# Patient Record
Sex: Female | Born: 1955 | Race: Black or African American | Hispanic: No | Marital: Married | Smoking: Never smoker
Health system: Southern US, Community
[De-identification: ages and names within clinical notes are randomized; demographics above are authoritative.]

## PROBLEM LIST (undated history)

## (undated) DIAGNOSIS — M1990 Unspecified osteoarthritis, unspecified site: Secondary | ICD-10-CM

## (undated) DIAGNOSIS — D841 Defects in the complement system: Secondary | ICD-10-CM

## (undated) DIAGNOSIS — A64 Unspecified sexually transmitted disease: Secondary | ICD-10-CM

## (undated) DIAGNOSIS — T783XXA Angioneurotic edema, initial encounter: Secondary | ICD-10-CM

## (undated) DIAGNOSIS — I10 Essential (primary) hypertension: Secondary | ICD-10-CM

## (undated) HISTORY — DX: Angioneurotic edema, initial encounter: T78.3XXA

## (undated) HISTORY — DX: Unspecified osteoarthritis, unspecified site: M19.90

## (undated) HISTORY — DX: Unspecified sexually transmitted disease: A64

---

## 1987-02-23 DIAGNOSIS — A64 Unspecified sexually transmitted disease: Secondary | ICD-10-CM

## 1987-02-23 HISTORY — DX: Unspecified sexually transmitted disease: A64

## 1998-05-15 ENCOUNTER — Ambulatory Visit (HOSPITAL_COMMUNITY): Admission: RE | Admit: 1998-05-15 | Discharge: 1998-05-15 | Payer: Self-pay

## 1998-05-30 ENCOUNTER — Other Ambulatory Visit: Admission: RE | Admit: 1998-05-30 | Discharge: 1998-05-30 | Payer: Self-pay | Admitting: Obstetrics and Gynecology

## 1998-08-23 HISTORY — PX: TUBAL LIGATION: SHX77

## 1998-09-01 ENCOUNTER — Encounter: Payer: Self-pay | Admitting: Obstetrics and Gynecology

## 1998-09-02 ENCOUNTER — Ambulatory Visit (HOSPITAL_COMMUNITY): Admission: RE | Admit: 1998-09-02 | Discharge: 1998-09-02 | Payer: Self-pay | Admitting: Obstetrics and Gynecology

## 2002-01-01 ENCOUNTER — Other Ambulatory Visit: Admission: RE | Admit: 2002-01-01 | Discharge: 2002-01-01 | Payer: Self-pay | Admitting: Obstetrics and Gynecology

## 2003-01-28 ENCOUNTER — Other Ambulatory Visit: Admission: RE | Admit: 2003-01-28 | Discharge: 2003-01-28 | Payer: Self-pay | Admitting: Obstetrics and Gynecology

## 2004-02-14 ENCOUNTER — Other Ambulatory Visit: Admission: RE | Admit: 2004-02-14 | Discharge: 2004-02-14 | Payer: Self-pay | Admitting: Obstetrics and Gynecology

## 2005-05-06 ENCOUNTER — Other Ambulatory Visit: Admission: RE | Admit: 2005-05-06 | Discharge: 2005-05-06 | Payer: Self-pay | Admitting: Obstetrics & Gynecology

## 2006-05-12 ENCOUNTER — Other Ambulatory Visit: Admission: RE | Admit: 2006-05-12 | Discharge: 2006-05-12 | Payer: Self-pay | Admitting: *Deleted

## 2007-07-06 ENCOUNTER — Other Ambulatory Visit: Admission: RE | Admit: 2007-07-06 | Discharge: 2007-07-06 | Payer: Self-pay | Admitting: Obstetrics and Gynecology

## 2012-02-24 ENCOUNTER — Other Ambulatory Visit: Payer: Self-pay | Admitting: Family Medicine

## 2012-02-24 ENCOUNTER — Ambulatory Visit
Admission: RE | Admit: 2012-02-24 | Discharge: 2012-02-24 | Disposition: A | Discharge status: Home / Self Care | Payer: Managed Care, Other (non HMO) | Source: Ambulatory Visit | Attending: Family Medicine | Admitting: Family Medicine

## 2012-02-24 DIAGNOSIS — M25519 Pain in unspecified shoulder: Secondary | ICD-10-CM

## 2012-07-04 ENCOUNTER — Other Ambulatory Visit: Payer: Self-pay | Admitting: Orthopedic Surgery

## 2012-07-10 ENCOUNTER — Encounter (HOSPITAL_BASED_OUTPATIENT_CLINIC_OR_DEPARTMENT_OTHER): Payer: Self-pay | Admitting: *Deleted

## 2012-07-14 NOTE — H&P (Signed)
Sheri Shelton is an 57 y.o. female.   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 Xrays reveal significant degenerative arthritis of right ring finger distal interphalangeal joint with likely loose body present. 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  Two-month history of enlarging mucoid cyst with significant degenerative arthritis of right ring finger distal interphalangeal joint.  Past Medical History  Diagnosis Date  . Hypertension     No past surgical history on file.  No family history on file. Social History:  has no tobacco, alcohol, and drug history on file.  Allergies:  Allergies  Allergen Reactions  . Soy Allergy Nausea And Vomiting    SOY MILK ONLY    No prescriptions prior to admission    No results found for this or any previous visit (from the past 48 hour(s)). No results found.  Review of Systems  Constitutional: Negative.   HENT: Negative.   Eyes: Negative.   Respiratory: Negative.   Cardiovascular: Negative.   Gastrointestinal: Negative.   Genitourinary: Negative.   Musculoskeletal: Negative.   Skin: Negative.   Neurological: Negative.   Endo/Heme/Allergies: Negative.   Psychiatric/Behavioral: Negative.     Height 5' 5.25" (1.657 m), weight 69.854 kg (154 lb). Physical Exam  Constitutional: She is oriented to person, place, and time. She appears well-nourished.  HENT:  Head: Normocephalic and atraumatic.  Eyes: Conjunctivae and EOM are normal. Pupils are equal, round, and reactive to light.  Neck: Normal range of motion. Neck supple.  Cardiovascular: Normal rate, regular rhythm, normal heart sounds and intact distal pulses.   Respiratory: Effort normal and breath sounds normal.  GI: Soft. Bowel sounds are normal.  Musculoskeletal: Normal range of motion.  Significant degenerative arthritis right ring finger distal interphalangeal joint. X-rays reveal marked joint space narrowing. Possible loose body.   Neurological: She is alert and oriented to person, place, and time. She has normal reflexes.  Psychiatric: She has a normal mood and affect. Her behavior is normal. Judgment and thought content normal.     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 I and distal interphalangeal joint debridement with loose body removal irrigation synovectomy and excision of mucoid cyst.  The surgery aftercare potential risk and benefits were explained in detail. Questions were invited and answered.  Dontreal Miera JR,Carmon Brigandi V 07/14/2012, 2:45 PM

## 2012-07-18 ENCOUNTER — Ambulatory Visit (HOSPITAL_BASED_OUTPATIENT_CLINIC_OR_DEPARTMENT_OTHER)
Admission: RE | Admit: 2012-07-18 | Discharge: 2012-07-18 | Disposition: A | Discharge status: Home / Self Care | Payer: Managed Care, Other (non HMO) | Source: Ambulatory Visit | Attending: Orthopedic Surgery | Admitting: Orthopedic Surgery

## 2012-07-18 ENCOUNTER — Encounter (HOSPITAL_BASED_OUTPATIENT_CLINIC_OR_DEPARTMENT_OTHER)
Admission: RE | Disposition: A | Discharge status: Home / Self Care | Payer: Self-pay | Source: Ambulatory Visit | Attending: Orthopedic Surgery

## 2012-07-18 ENCOUNTER — Encounter (HOSPITAL_BASED_OUTPATIENT_CLINIC_OR_DEPARTMENT_OTHER): Payer: Self-pay | Admitting: Orthopedic Surgery

## 2012-07-18 DIAGNOSIS — M19049 Primary osteoarthritis, unspecified hand: Secondary | ICD-10-CM | POA: Insufficient documentation

## 2012-07-18 DIAGNOSIS — D2110 Benign neoplasm of connective and other soft tissue of unspecified upper limb, including shoulder: Secondary | ICD-10-CM | POA: Insufficient documentation

## 2012-07-18 DIAGNOSIS — M659 Unspecified synovitis and tenosynovitis, unspecified site: Secondary | ICD-10-CM | POA: Insufficient documentation

## 2012-07-18 DIAGNOSIS — M898X9 Other specified disorders of bone, unspecified site: Secondary | ICD-10-CM | POA: Insufficient documentation

## 2012-07-18 HISTORY — DX: Essential (primary) hypertension: I10

## 2012-07-18 HISTORY — PX: MASS EXCISION: SHX2000

## 2012-07-18 SURGERY — MINOR EXCISION OF MASS
Anesthesia: LOCAL | Site: Hand | Laterality: Right | Wound class: Clean

## 2012-07-18 MED ORDER — OXYCODONE-ACETAMINOPHEN 5-325 MG PO TABS: ORAL_TABLET | ORAL | Status: DC

## 2012-07-18 MED ORDER — DOXYCYCLINE HYCLATE 50 MG PO CAPS: 50.0000 mg | ORAL_CAPSULE | Freq: Two times a day (BID) | ORAL | Status: DC

## 2012-07-18 MED ORDER — LIDOCAINE HCL 2 % IJ SOLN
INTRAMUSCULAR | Status: DC | PRN
  Administered 2012-07-18: 4 mL

## 2012-07-18 MED ORDER — CHLORHEXIDINE GLUCONATE 4 % EX LIQD: 60.0000 mL | Freq: Once | CUTANEOUS | Status: DC

## 2012-07-18 SURGICAL SUPPLY — 41 items
BANDAGE ADHESIVE 1X3 (GAUZE/BANDAGES/DRESSINGS) IMPLANT
BLADE SURG 15 STRL LF DISP TIS (BLADE) ×1 IMPLANT
BLADE SURG 15 STRL SS (BLADE) ×1
BNDG COHESIVE 1X5 TAN STRL LF (GAUZE/BANDAGES/DRESSINGS) ×2 IMPLANT
BNDG ELASTIC 2 VLCR STRL LF (GAUZE/BANDAGES/DRESSINGS) IMPLANT
BNDG ESMARK 4X9 LF (GAUZE/BANDAGES/DRESSINGS) IMPLANT
BRUSH SCRUB EZ PLAIN DRY (MISCELLANEOUS) ×2 IMPLANT
CLOTH BEACON ORANGE TIMEOUT ST (SAFETY) ×2 IMPLANT
CORDS BIPOLAR (ELECTRODE) IMPLANT
COVER MAYO STAND STRL (DRAPES) ×2 IMPLANT
CUFF TOURNIQUET SINGLE 18IN (TOURNIQUET CUFF) IMPLANT
DECANTER SPIKE VIAL GLASS SM (MISCELLANEOUS) IMPLANT
DRAIN PENROSE 1/2X12 LTX STRL (WOUND CARE) ×2 IMPLANT
DRAPE SURG 17X23 STRL (DRAPES) ×2 IMPLANT
GAUZE SPONGE 4X4 12PLY STRL LF (GAUZE/BANDAGES/DRESSINGS) ×4 IMPLANT
GAUZE XEROFORM 1X8 LF (GAUZE/BANDAGES/DRESSINGS) ×2 IMPLANT
GLOVE BIO SURGEON STRL SZ 6 (GLOVE) ×2 IMPLANT
GLOVE BIOGEL M STRL SZ7.5 (GLOVE) IMPLANT
GLOVE ECLIPSE 6.5 STRL STRAW (GLOVE) ×2 IMPLANT
GLOVE EXAM NITRILE EXT CUFF MD (GLOVE) ×2 IMPLANT
GLOVE INDICATOR 6.5 STRL GRN (GLOVE) ×2 IMPLANT
GLOVE INDICATOR 7.0 STRL GRN (GLOVE) ×2 IMPLANT
GLOVE ORTHO TXT STRL SZ7.5 (GLOVE) ×2 IMPLANT
GOWN BRE IMP PREV XXLGXLNG (GOWN DISPOSABLE) IMPLANT
GOWN PREVENTION PLUS XLARGE (GOWN DISPOSABLE) IMPLANT
NEEDLE 27GAX1X1/2 (NEEDLE) ×2 IMPLANT
NEEDLE BLUNT 17GA (NEEDLE) ×2 IMPLANT
PACK BASIN DAY SURGERY FS (CUSTOM PROCEDURE TRAY) ×2 IMPLANT
PADDING CAST ABS 4INX4YD NS (CAST SUPPLIES)
PADDING CAST ABS COTTON 4X4 ST (CAST SUPPLIES) IMPLANT
SPONGE GAUZE 4X4 12PLY (GAUZE/BANDAGES/DRESSINGS) IMPLANT
STOCKINETTE 4X48 STRL (DRAPES) ×2 IMPLANT
STRIP CLOSURE SKIN 1/2X4 (GAUZE/BANDAGES/DRESSINGS) IMPLANT
SUT ETHILON 5 0 P 3 18 (SUTURE)
SUT NYLON ETHILON 5-0 P-3 1X18 (SUTURE) IMPLANT
SUT PROLENE 4 0 P 3 18 (SUTURE) ×2 IMPLANT
SYR 3ML 23GX1 SAFETY (SYRINGE) IMPLANT
SYR CONTROL 10ML LL (SYRINGE) ×4 IMPLANT
TOWEL OR 17X24 6PK STRL BLUE (TOWEL DISPOSABLE) ×4 IMPLANT
TRAY DSU PREP LF (CUSTOM PROCEDURE TRAY) ×2 IMPLANT
UNDERPAD 30X30 INCONTINENT (UNDERPADS AND DIAPERS) ×2 IMPLANT

## 2012-07-18 NOTE — Brief Op Note (Signed)
07/18/2012  12:28 PM  PATIENT:  Sheri Shelton  57 y.o. female  PRE-OPERATIVE DIAGNOSIS:  CYSTIC MASS RIGHT RING FINGER DORSUM DIP JOINT  POST-OPERATIVE DIAGNOSIS:  CYSTIC MASS RIGHT RING FINGER DORSUM DIP JOINT  PROCEDURE:  Procedure(s): MINOR EXCISION MASS DORSUM WITH DIP JOINT ARTHROTOMY RIGHT RING FINGER (Right)  SURGEON:  Surgeon(s) and Role:    * Wyn Forster., MD - Primary  PHYSICIAN ASSISTANT:   ASSISTANTS: Surgical technician   ANESTHESIA:   local  EBL:     BLOOD ADMINISTERED:none  DRAINS: none   LOCAL MEDICATIONS USED:  XYLOCAINE   SPECIMEN:  No Specimen  DISPOSITION OF SPECIMEN:  N/A  COUNTS:  YES  TOURNIQUET:   Total Tourniquet Time Documented: area (laterality) - 14 minutes Total: area (laterality) - 14 minutes   DICTATION: .Other Dictation: Dictation Number [id number]  PLAN OF CARE: Discharge to home after PACU  PATIENT DISPOSITION:  PACU - hemodynamically stable.   Delay start of Pharmacological VTE agent (>24hrs) due to surgical blood loss or risk of bleeding: not applicable

## 2012-07-18 NOTE — Interval H&P Note (Signed)
History and Physical Interval Note:  07/18/2012 11:32 AM  Sheri Shelton  has presented today for surgery, with the diagnosis of CYSTIC MASS RIGHT RING FINGER DORSUM DIP JOINT  The various methods of treatment have been discussed with the patient and family. After consideration of risks, benefits and other options for treatment, the patient has consented to  Procedure(s): MINOR EXCISION MASS DORSUM WITH DIP JOINT ARTHROTOMY RIGHT RING FINGER (Right) as a surgical intervention .  The patient's history has been reviewed, patient examined, no change in status, stable for surgery.  I have reviewed the patient's chart and labs.  Questions were answered to the patient's satisfaction.     Amilio Zehnder JR,Amarea Macdowell V

## 2012-07-18 NOTE — Op Note (Signed)
[id number] 

## 2012-07-19 NOTE — Op Note (Signed)
[name], [name] NO.:  [id number]  MEDICAL RECORD NO.:  [id number]  LOCATION:                                 FACILITY:  PHYSICIAN:  Katy Fitch. Devory Mckinzie, M.D. DATE OF BIRTH:  01/14/1956  DATE OF PROCEDURE:  07/18/2012 DATE OF DISCHARGE:                              OPERATIVE REPORT   PREOPERATIVE DIAGNOSIS:  Large mucoid cyst dorsal ulnar aspect of right ring finger distal interphalangeal joint with plain x-ray evidence of advanced osteoarthritis and no radio-opaque osteochondral loose bodies.  POSTOPERATIVE DIAGNOSIS:  Large mucoid cyst dorsal ulnar aspect of right ring finger distal interphalangeal joint with plain x-ray evidence of advanced osteoarthritis and no radio-opaque osteochondral loose bodies with identification of marked synovitis and multiple chondral loose bodies within distal interphalangeal joint.  OPERATION: 1. Resection of mucoid cyst, right ring finger. 2. DIP joint arthrotomy with synovectomy and removal of loose bodies,     right ring finger.  OPERATING SURGEON:  Katy Fitch. Altair Appenzeller, M.D.  ASSISTANT:  Surgical technician.  ANESTHESIA:  2% lidocaine metacarpal head level block of right ring finger.  This was performed as a minor operating room procedure.  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  She has had a history of swelling at her right ring finger DIP joint and then developed an 8 mm diameter mucoid cyst.  This was thinning and preparing to rupture.  She was referred by her physician for evaluation and management of this predicament.  We advised her that it was driven by underlying osteoarthritis.  She understands we cannot change the natural history of osteoarthritis as it is primarily a genetic and/or posttraumatic predicament.  We can however modify the natural history of the mucoid cyst by DIP joint debridement, removal of loose bodies, and synovectomy.  __________  100% successful debridement of DIP joint typically leads to correction of mucoid cyst above 90%.  After these issues were discussed in detail in the office, Sheri Shelton decided to proceed with surgery at this time.  Preoperatively, she was reminded of the risks and benefits of surgery. Her finger was marked with a proper surgical identification site protocol in the holding area.  PROCEDURE:  Sheri Shelton was escorted to room 6 of the [facility] and placed in a supine position upon the operating table.  After anesthesia, informed consent, and alcohol Betadine prep, her right ring finger was anesthetized with 3 mL of 2% lidocaine at metacarpal head level to obtain a digital block.  After 10 minutes, excellent anesthesia was achieved.  The right hand and arm were then prepped with Betadine soap and solution, sterilely draped.  The finger was exsanguinated by direct compression and a padded Penrose 0.5 inch drain was placed over the proximal phalangeal segment as a digital tourniquet.  After confirming adequate anesthesia, a routine surgical time-out was accomplished followed by a curvilinear incision incorporating the cyst in the distal portion of the incision.  Subcutaneous tissues were carefully divided taking care to identify the cyst membrane.  The cyst membrane was excised with a micro rongeur followed by an arthrotomy between the terminal extensor tendon slip and  the ulnar collateral ligament.  The capsule was excised and abundant synovitis identified.  Micro rongeur and house curette were used to remove the synovitis followed by removal of marginal osteophytes with a micro curette and rongeur.  The DIP joint was then thoroughly irrigated with a 19-gauge blunt dental needle and sterile saline using a syringe for pressure. Once again the subcutaneous region was debrided thoroughly to be sure all membrane and the neck of the cyst was excised followed by repair of the  skin with intradermal 4-0 Prolene.  Xeroflo dressing was applied followed by release of the tourniquet. Direct pressure was held for 5 minutes by a sterile gauze dressing with Coban.  Sheri Shelton is advised to elevate her hand for several days.  She will work on range of motion exercises.  She may return to work tomorrow.  We will see her back for followup in the office in 1 week for dressing removal and suture removal.  She has been advised not to change her dressing at home.  It is quite easy for individuals to get the dressing too tight on a finger.  If she has difficulties with the dressing, she will call our office and we will change it for her on an as-needed basis.     Katy Fitch Sway Guttierrez, M.D.     RVS/MEDQ  D:  07/18/2012  T:  07/19/2012  Job:  [id number]

## 2012-12-26 ENCOUNTER — Encounter: Payer: Self-pay | Admitting: Gynecology

## 2012-12-26 DIAGNOSIS — I10 Essential (primary) hypertension: Secondary | ICD-10-CM | POA: Insufficient documentation

## 2012-12-27 ENCOUNTER — Ambulatory Visit (INDEPENDENT_AMBULATORY_CARE_PROVIDER_SITE_OTHER): Payer: Managed Care, Other (non HMO) | Admitting: Gynecology

## 2012-12-27 ENCOUNTER — Encounter: Payer: Self-pay | Admitting: Gynecology

## 2012-12-27 VITALS — BP 124/68 | HR 66 | Resp 18 | Ht 65.0 in | Wt 157.0 lb

## 2012-12-27 DIAGNOSIS — Z0000 Encounter for general adult medical examination without abnormal findings: Secondary | ICD-10-CM

## 2012-12-27 DIAGNOSIS — Z01419 Encounter for gynecological examination (general) (routine) without abnormal findings: Secondary | ICD-10-CM

## 2012-12-27 LAB — POCT URINALYSIS DIPSTICK

## 2012-12-27 NOTE — Patient Instructions (Signed)

## 2012-12-27 NOTE — Progress Notes (Signed)
57 y.o. Married [place] American female   [phone] here for annual exam. Pt reports menses are not regular.  She does report hot flashes, does have night sweats for 43m, does not have vaginal dryness.  She is not using lubricants.  She does not report post-menopasual bleeding.    Patient's last menstrual period was 10/12/2011.          Sexually active: yes  The current method of family planning is post menopausal status.    Exercising: yes  treadmill, weights 3x/wk Last pap: 11/18/11 NEG HR HPV Abnormal PAP: no Mammogram: 10/18/12 Bi-Rads 1 BSE: yes Colonoscopy: May or June 2014 DEXA: 2007 Alcohol: 3 glasses of wine/wk Tobacco: no   Hgb: PCP ; Urine: Negative   Health Maintenance  Topic Date Due  . Pap Smear  03/24/1974  . Tetanus/tdap  03/25/1975  . Colonoscopy  03/24/2006  . Influenza Vaccine  09/22/2012  . Mammogram  10/19/2014    Family History  Problem Relation Age of Onset  . Heart disease Father   . Diabetes Maternal Grandmother   . Breast cancer Maternal Grandmother     Patient Active Problem List   Diagnosis Date Noted  . Hypertension     Past Medical History  Diagnosis Date  . Hypertension   . STD (sexually transmitted disease) 1989    History of GC/CHL    Past Surgical History  Procedure Laterality Date  . Mass excision Right 07/18/2012    Procedure: MINOR EXCISION MASS DORSUM WITH DIP JOINT ARTHROTOMY RIGHT RING FINGER;  Surgeon: Wyn Forster., MD;  Location: [facility];  Service: Orthopedics;  Laterality: Right;  . Tubal ligation  08/1998    Allergies: Soy allergy  Current Outpatient Prescriptions  Medication Sig Dispense Refill  . cholecalciferol (VITAMIN D) 1000 UNITS tablet Take 1,000 Units by mouth daily.      . Multiple Vitamin (MULTIVITAMIN) tablet Take 1 tablet by mouth daily.      . NORVASC 5 MG tablet daily.      Marland Kitchen telmisartan-hydrochlorothiazide (MICARDIS HCT) 80-12.5 MG per tablet Take 1 tablet by mouth daily.        No current facility-administered medications for this visit.    ROS: Pertinent items are noted in HPI.  Exam:    BP 124/68  Pulse 66  Resp 18  LMP 10/12/2011 Weight change: @WEIGHTCHANGE @ Last 3 height recordings:  Ht Readings from Last 3 Encounters:  07/10/12 5' 5.25" (1.657 m)  07/10/12 5' 5.25" (1.657 m)   General appearance: alert, cooperative and appears stated age Head: Normocephalic, without obvious abnormality, atraumatic Neck: no adenopathy, no carotid bruit, no JVD, supple, symmetrical, trachea midline and thyroid not enlarged, symmetric, no tenderness/mass/nodules Lungs: clear to auscultation bilaterally Breasts: normal appearance, no masses or tenderness Heart: regular rate and rhythm, S1, S2 normal, no murmur, click, rub or gallop Abdomen: soft, non-tender; bowel sounds normal; no masses,  no organomegaly Extremities: extremities normal, atraumatic, no cyanosis or edema Skin: Skin color, texture, turgor normal. No rashes or lesions Lymph nodes: Cervical, supraclavicular, and axillary nodes normal. no inguinal nodes palpated Neurologic: Grossly normal   Pelvic: External genitalia:  no lesions              Urethra: normal appearing urethra with no masses, tenderness or lesions              Bartholins and Skenes: normal                 Vagina:  normal appearing vagina with normal color and discharge, no lesions              Cervix: normal appearance              Pap taken: no        Bimanual Exam:  Uterus:  uterus is normal size, shape, consistency and nontender                                      Adnexa:    normal adnexa in size, nontender and no masses                                      Rectovaginal: Confirms                                      Anus:  normal sphincter tone, no lesions  A: well woman Contraceptive management     P: mammogram pap smear not done counseled on breast self exam, mammography screening, adequate intake of calcium and vitamin  D, diet and exercise return annually or prn Discussed PAP guideline changes, importance of weight bearing exercises, calcium, vit D and balanced diet.  An After Visit Summary was printed and given to the patient.

## 2012-12-28 ENCOUNTER — Other Ambulatory Visit: Payer: Self-pay

## 2013-01-17 ENCOUNTER — Emergency Department (HOSPITAL_BASED_OUTPATIENT_CLINIC_OR_DEPARTMENT_OTHER)
Admission: EM | Admit: 2013-01-17 | Discharge: 2013-01-18 | Disposition: A | Discharge status: Home / Self Care | Payer: Managed Care, Other (non HMO) | Attending: Emergency Medicine | Admitting: Emergency Medicine

## 2013-01-17 ENCOUNTER — Encounter (HOSPITAL_BASED_OUTPATIENT_CLINIC_OR_DEPARTMENT_OTHER): Payer: Self-pay | Admitting: Emergency Medicine

## 2013-01-17 DIAGNOSIS — T783XXA Angioneurotic edema, initial encounter: Secondary | ICD-10-CM | POA: Insufficient documentation

## 2013-01-17 DIAGNOSIS — T465X5A Adverse effect of other antihypertensive drugs, initial encounter: Secondary | ICD-10-CM | POA: Insufficient documentation

## 2013-01-17 DIAGNOSIS — I10 Essential (primary) hypertension: Secondary | ICD-10-CM | POA: Insufficient documentation

## 2013-01-17 DIAGNOSIS — Z8619 Personal history of other infectious and parasitic diseases: Secondary | ICD-10-CM | POA: Insufficient documentation

## 2013-01-17 DIAGNOSIS — Z79899 Other long term (current) drug therapy: Secondary | ICD-10-CM | POA: Insufficient documentation

## 2013-01-17 DIAGNOSIS — T464X5A Adverse effect of angiotensin-converting-enzyme inhibitors, initial encounter: Secondary | ICD-10-CM

## 2013-01-17 NOTE — ED Notes (Signed)
Pt started norvasc 3 months ago but was recently started on micardis along with norvasc

## 2013-01-18 MED ORDER — METHYLPREDNISOLONE SODIUM SUCC 125 MG IJ SOLR: 125.0000 mg | Freq: Once | INTRAMUSCULAR | Status: AC

## 2013-01-18 MED ORDER — FAMOTIDINE 20 MG PO TABS: 20.0000 mg | ORAL_TABLET | Freq: Two times a day (BID) | ORAL | Status: DC

## 2013-01-18 MED ORDER — METHYLPREDNISOLONE SODIUM SUCC 125 MG IJ SOLR
INTRAMUSCULAR | Status: AC
  Administered 2013-01-18: 125 mg via INTRAVENOUS
  Filled 2013-01-18: qty 2

## 2013-01-18 MED ORDER — DIPHENHYDRAMINE HCL 50 MG/ML IJ SOLN: 25.0000 mg | Freq: Once | INTRAMUSCULAR | Status: AC

## 2013-01-18 MED ORDER — PREDNISONE 20 MG PO TABS: ORAL_TABLET | ORAL | Status: DC

## 2013-01-18 MED ORDER — DIPHENHYDRAMINE HCL 50 MG/ML IJ SOLN
INTRAMUSCULAR | Status: AC
  Administered 2013-01-18: 25 mg via INTRAVENOUS
  Filled 2013-01-18: qty 1

## 2013-01-18 MED ORDER — FAMOTIDINE IN NACL 20-0.9 MG/50ML-% IV SOLN
20.0000 mg | Freq: Once | INTRAVENOUS | Status: AC
  Administered 2013-01-18: 20 mg via INTRAVENOUS

## 2013-01-18 MED ORDER — FAMOTIDINE IN NACL 20-0.9 MG/50ML-% IV SOLN
INTRAVENOUS | Status: AC
  Filled 2013-01-18: qty 50

## 2013-01-18 NOTE — ED Provider Notes (Signed)
CSN: [id number]     Arrival date & time 01/17/13  2350 History   First MD Initiated Contact with Patient 01/17/13 2356     Chief Complaint  Patient presents with  . Allergic Reaction   (Consider location/radiation/quality/duration/timing/severity/associated sxs/prior Treatment) Patient is a 57 y.o. female presenting with allergic reaction. The history is provided by the patient.  Allergic Reaction Presenting symptoms: swelling   Presenting symptoms: no wheezing   Presenting symptoms comment:  Tongue Swelling:    Location: tongue.   Onset quality:  Gradual   Timing:  Constant   Progression:  Unchanged   Chronicity:  Recurrent (4th time in four months) Severity:  Moderate Context: medications   Context: no animal exposure   Relieved by:  Nothing Worsened by:  Nothing tried Ineffective treatments:  None tried   Past Medical History  Diagnosis Date  . Hypertension   . STD (sexually transmitted disease) 1989    History of GC/CHL   Past Surgical History  Procedure Laterality Date  . Mass excision Right 07/18/2012    Procedure: MINOR EXCISION MASS DORSUM WITH DIP JOINT ARTHROTOMY RIGHT RING FINGER;  Surgeon: Wyn Forster., MD;  Location: [facility];  Service: Orthopedics;  Laterality: Right;  . Tubal ligation  08/1998   Family History  Problem Relation Age of Onset  . Heart disease Father   . Diabetes Maternal Grandmother   . Breast cancer Maternal Grandmother    History  Substance Use Topics  . Smoking status: Never Smoker   . Smokeless tobacco: Not on file  . Alcohol Use: 1.8 oz/week    3 Glasses of wine per week   OB History   Grav Para Term Preterm Abortions TAB SAB Ect Mult Living   4 2 2  2     2      Review of Systems  Respiratory: Negative for shortness of breath and wheezing.   All other systems reviewed and are negative.    Allergies  Soy allergy  Home Medications   Current Outpatient Rx  Name  Route  Sig  Dispense  Refill  .  diphenhydrAMINE (BENADRYL) 25 MG tablet   Oral   Take 50 mg by mouth every 6 (six) hours as needed.         Marland Kitchen ibuprofen (ADVIL,MOTRIN) 200 MG tablet   Oral   Take 200 mg by mouth every 6 (six) hours as needed.         . NORVASC 5 MG tablet      daily.         Marland Kitchen telmisartan-hydrochlorothiazide (MICARDIS HCT) 80-12.5 MG per tablet   Oral   Take 1 tablet by mouth daily.         . cholecalciferol (VITAMIN D) 1000 UNITS tablet   Oral   Take 1,000 Units by mouth daily.         . Multiple Vitamin (MULTIVITAMIN) tablet   Oral   Take 1 tablet by mouth daily.          BP 107/83  Pulse 74  Temp(Src) 98.3 F (36.8 C) (Oral)  Resp 18  Ht 5\' 5"  (1.651 m)  Wt 157 lb (71.215 kg)  BMI 26.13 kg/m2  SpO2 99%  LMP 10/12/2011 Physical Exam  Constitutional: She is oriented to person, place, and time. She appears well-developed and well-nourished. No distress.  HENT:  Head: Normocephalic and atraumatic.  Mouth/Throat: Oropharynx is clear and moist.  Swelling of the tongue, mallempati class 3  Eyes: Conjunctivae are normal. Pupils are equal, round, and reactive to light.  Neck: Normal range of motion. Neck supple.  Cardiovascular: Normal rate, regular rhythm and intact distal pulses.   Pulmonary/Chest: Effort normal and breath sounds normal. No stridor. She has no wheezes. She has no rales.  Abdominal: Soft. Bowel sounds are normal. There is no tenderness. There is no rebound and no guarding.  Musculoskeletal: Normal range of motion.  Neurological: She is alert and oriented to person, place, and time.  Skin: Skin is warm and dry. No rash noted.  Psychiatric: She has a normal mood and affect.    ED Course  Procedures (including critical care time) Labs Review Labs Reviewed - No data to display Imaging Review No results found.  EKG Interpretation   None       MDM  No diagnosis found. Given steroids and pepcid and benadryl with significant improvement.  Mallempati  class one,  Feels back to normal.  Stop micardis immediately and list as allergy.  Will prescribe pepcid and steroids continue benadryl every six gours for 2 days, call your doctor to say you have stopped micardis.  Call 911 immediately for worsening swelling of the lips or tongue difficulty swallowing or breathing or any concerns.  Patient verbalizes understanding and agrees to follow up    Osmin Welz K Karri Kallenbach-Rasch, MD 01/18/13 0117

## 2013-01-18 NOTE — ED Notes (Signed)
Pt called husband to come pick her up 

## 2013-12-24 ENCOUNTER — Encounter (HOSPITAL_BASED_OUTPATIENT_CLINIC_OR_DEPARTMENT_OTHER): Payer: Self-pay | Admitting: Emergency Medicine

## 2014-04-24 IMAGING — CR DG SHOULDER 2+V*L*
3 series · 3 of 3 positions shown · non-contrast
Comparison: None

CLINICAL DATA: 56-year-old female with left shoulder pain.

LEFT SHOULDER - 2+ VIEW

[view not recorded (1 of 3)]
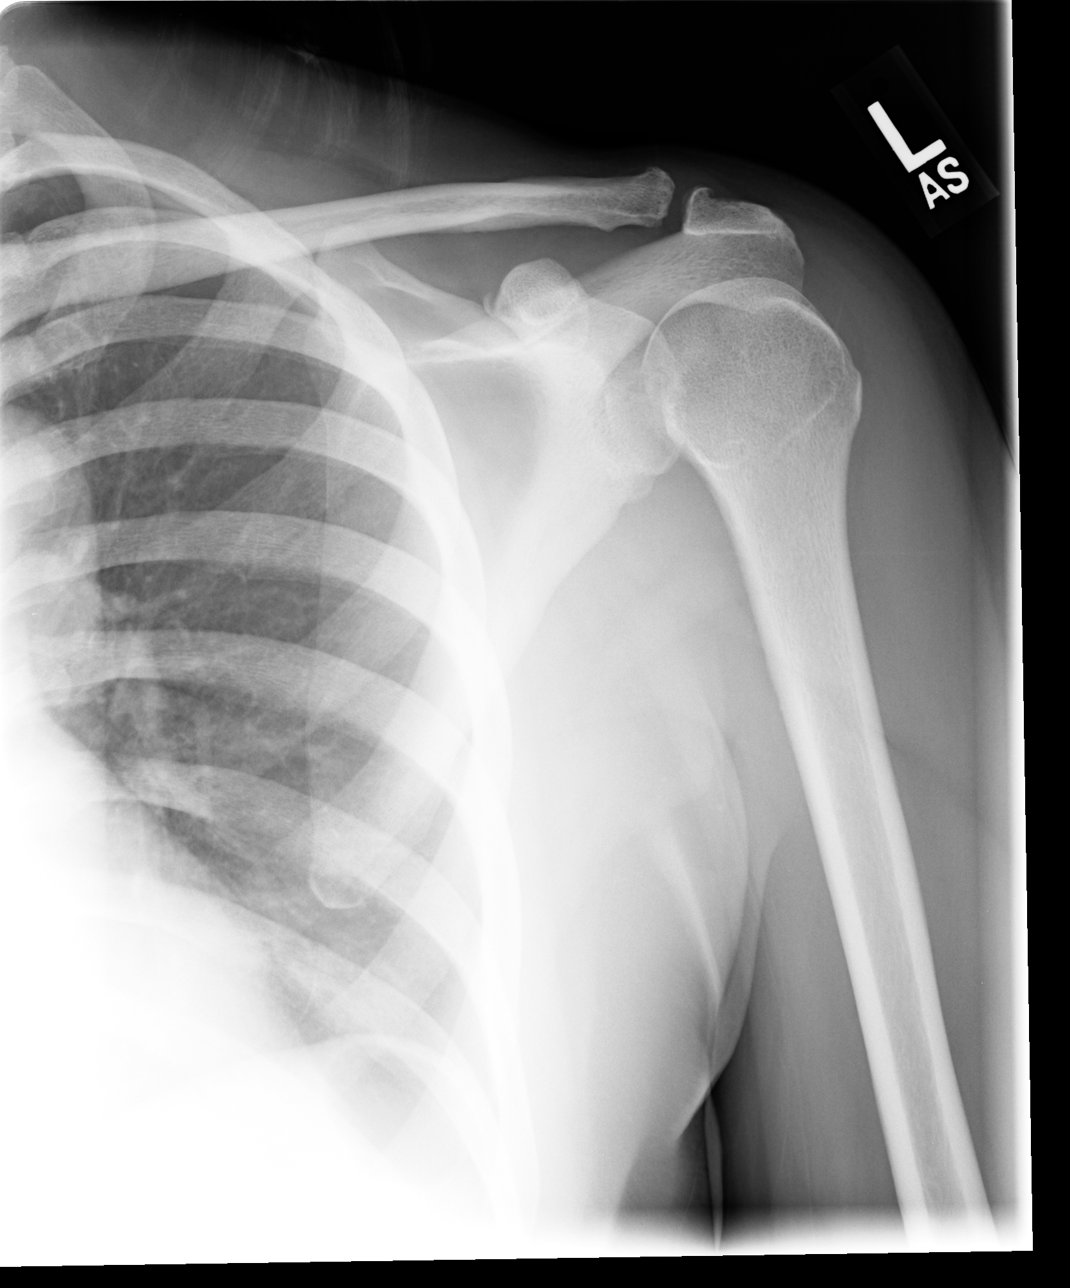

[view not recorded (2 of 3)]
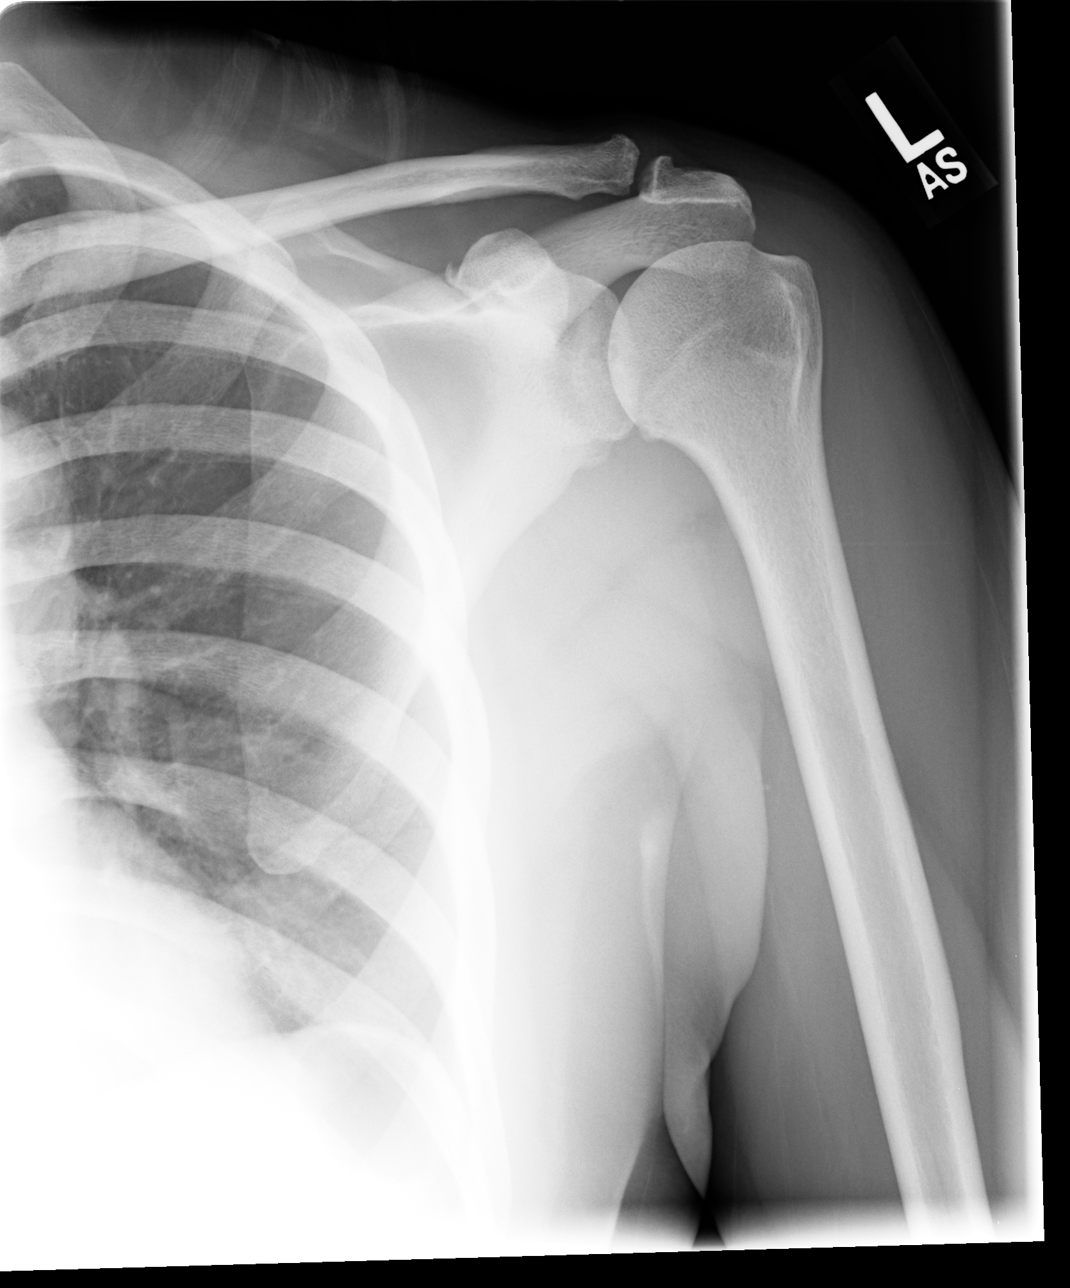

[view not recorded (3 of 3)]
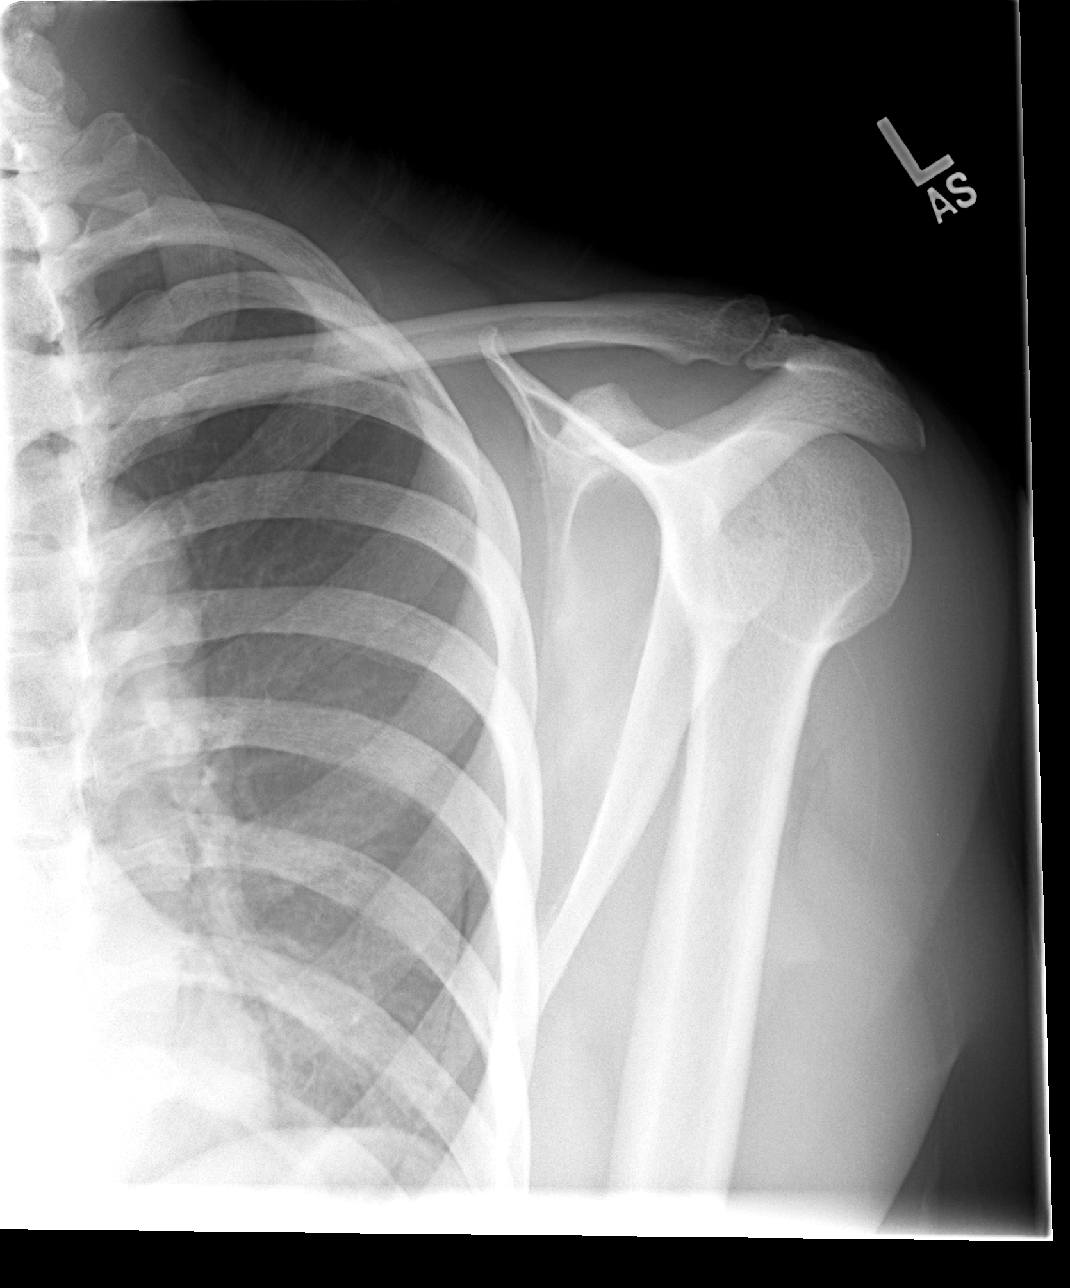

[3 of 3 positions shown; findings below may reference images not displayed]

FINDINGS: There is no evidence of acute bony abnormality.
There is no evidence of acute fracture, subluxation, or
dislocation.
No focal bony lesions are identified.
The visualized left hemithorax is unremarkable.
Minimal degenerative changes at the glenohumeral and AC joints
noted.
IMPRESSION: Minimal degenerative changes of the glenohumeral and AC joints.

No other significant abnormalities identified.

## 2014-05-10 ENCOUNTER — Telehealth: Payer: Self-pay | Admitting: *Deleted

## 2014-05-10 ENCOUNTER — Ambulatory Visit (INDEPENDENT_AMBULATORY_CARE_PROVIDER_SITE_OTHER): Payer: BLUE CROSS/BLUE SHIELD | Admitting: Nurse Practitioner

## 2014-05-10 ENCOUNTER — Encounter: Payer: Self-pay | Admitting: Nurse Practitioner

## 2014-05-10 VITALS — BP 98/68 | HR 78 | Resp 14 | Ht 64.75 in | Wt 150.0 lb

## 2014-05-10 DIAGNOSIS — Z113 Encounter for screening for infections with a predominantly sexual mode of transmission: Secondary | ICD-10-CM

## 2014-05-10 DIAGNOSIS — R319 Hematuria, unspecified: Secondary | ICD-10-CM

## 2014-05-10 DIAGNOSIS — Z23 Encounter for immunization: Secondary | ICD-10-CM | POA: Diagnosis not present

## 2014-05-10 DIAGNOSIS — Z01419 Encounter for gynecological examination (general) (routine) without abnormal findings: Secondary | ICD-10-CM

## 2014-05-10 DIAGNOSIS — Z0000 Encounter for general adult medical examination without abnormal findings: Secondary | ICD-10-CM

## 2014-05-10 LAB — POCT URINALYSIS DIPSTICK
Leukocytes, UA: NEGATIVE
Urobilinogen, UA: NEGATIVE
pH, UA: 5

## 2014-05-10 LAB — HEMOGLOBIN, FINGERSTICK: HEMOGLOBIN, FINGERSTICK: 12.7 g/dL (ref 12.0–16.0)

## 2014-05-10 NOTE — Telephone Encounter (Signed)
LM to [place] - return call to [facility] re: patient bone density scan.

## 2014-05-10 NOTE — Progress Notes (Signed)
59 y.o. [id number] Married  African American Fe here for annual exam.  Some vaso symptoms.  Worse at night but so far still tolerable.  She is concerned about an eruption of sores that have occurred for years between the buttocks or on the vulva area.  She is thinking they may be HSV related and wants all STD's checked.  Husband has no symptoms.  Patient's last menstrual period was 09/23/2011.          Sexually active: Yes.    The current method of family planning is tubal ligation.    Exercising: Yes.    Light weights, walking 3-4 x/wk Smoker:  no  Health Maintenance: Pap:  11/18/11 NEG HR HPV negative  MMG:  10/23/13 Bi-Rads 1: Negative Colonoscopy:  07/29/2006 f/u in 2013 - states she had repeat last year -trying to get a copy BMD:  Year ago -  trying to get a copy TDaP:  10-11 years ago Labs: Hgb: 12.7 ; Urine: RBC's ++ (asymptomatic)   reports that she has never smoked. She does not have any smokeless tobacco history on file. She reports that she drinks about 1.8 oz of alcohol per week. She reports that she does not use illicit drugs.  Past Medical History  Diagnosis Date  . Hypertension   . STD (sexually transmitted disease) 1989    History of GC/CHL  . Angio-edema   . Arthritis     Shoulder, Neck    Past Surgical History  Procedure Laterality Date  . Mass excision Right 07/18/2012    Procedure: MINOR EXCISION MASS DORSUM WITH DIP JOINT ARTHROTOMY RIGHT RING FINGER;  Surgeon: Cammie Sickle., MD;  Location: [place];  Service: Orthopedics;  Laterality: Right;  . Tubal ligation  08/1998    Current Outpatient Prescriptions  Medication Sig Dispense Refill  . diphenhydrAMINE (BENADRYL) 25 MG tablet Take 50 mg by mouth every 6 (six) hours as needed.    . Multiple Vitamin (MULTIVITAMIN) tablet Take 1 tablet by mouth daily.    . ranitidine (ZANTAC) 150 MG capsule Take 150 mg by mouth 2 (two) times daily.   3  . cholecalciferol (VITAMIN D) 1000 UNITS tablet Take 1,000  Units by mouth daily.    . famotidine (PEPCID) 20 MG tablet Take 1 tablet (20 mg total) by mouth 2 (two) times daily. (Patient not taking: Reported on 05/10/2014) 30 tablet 0  . ibuprofen (ADVIL,MOTRIN) 200 MG tablet Take 200 mg by mouth every 6 (six) hours as needed.    . meloxicam (MOBIC) 7.5 MG tablet Take 7.5 mg by mouth daily.    . NORVASC 5 MG tablet daily.    . predniSONE (DELTASONE) 20 MG tablet 3 tabs po day one, then 2 po daily x 4 days (Patient not taking: Reported on 05/10/2014) 11 tablet 0  . [DISCONTINUED] telmisartan-hydrochlorothiazide (MICARDIS HCT) 80-12.5 MG per tablet Take 1 tablet by mouth daily.     No current facility-administered medications for this visit.    Family History  Problem Relation Age of Onset  . Heart disease Father   . Diabetes Maternal Grandmother   . Breast cancer Maternal Grandmother     ROS:  Pertinent items are noted in HPI.  Otherwise, a comprehensive ROS was negative.  Exam:   BP 98/68 mmHg  Pulse 78  Resp 14  Ht 5' 4.75" (1.645 m)  Wt 150 lb (68.04 kg)  BMI 25.14 kg/m2  LMP 09/23/2011 Height: 5' 4.75" (164.5 cm) Ht Readings from  Last 3 Encounters:  05/10/14 5' 4.75" (1.645 m)  01/17/13 5\' 5"  (1.651 m)  12/27/12 5\' 5"  (1.651 m)    General appearance: alert, cooperative and appears stated age Head: Normocephalic, without obvious abnormality, atraumatic Neck: no adenopathy, supple, symmetrical, trachea midline and thyroid normal to inspection and palpation Lungs: clear to auscultation bilaterally Breasts: normal appearance, no masses or tenderness Heart: regular rate and rhythm Abdomen: soft, non-tender; no masses,  no organomegaly Extremities: extremities normal, atraumatic, no cyanosis or edema Skin: Skin color, texture, turgor normal. No rashes or lesions Lymph nodes: Cervical, supraclavicular, and axillary nodes normal. No abnormal inguinal nodes palpated Neurologic: Grossly normal   Pelvic: External genitalia:  no lesions  and no lesions on buttocks              Urethra:  normal appearing urethra with no masses, tenderness or lesions              Bartholin's and Skene's: normal                 Vagina: normal appearing vagina with normal color and discharge, no lesions              Cervix: retroverted              Pap taken: Yes.   Bimanual Exam:  Uterus:  normal size, contour, position, consistency, mobility, non-tender              Adnexa: no mass, fullness, tenderness               Rectovaginal: Confirms               Anus:  normal sphincter tone, no lesions  Chaperone present: Yes  A:  Well Woman with normal exam  Postmenopausal  Update Immunization  Episodic vulvar/ buttock irritation ? HSV  R/O STD's  R/O UTI  Update TDaP today   P:   Reviewed health and wellness pertinent to exam  Pap smear taken today  Mammogram is due 9/16  Will follow with STD's, pap  TDaP is given today  Counseled on breast self exam, mammography screening, adequate intake of calcium and vitamin D, diet and exercise return annually or prn  An After Visit Summary was printed and given to the patient.

## 2014-05-10 NOTE — Telephone Encounter (Signed)
LM for pt to call back re: last Colonoscopy date and place.

## 2014-05-10 NOTE — Patient Instructions (Signed)

## 2014-05-11 LAB — STD PANEL
HEP B S AG: NEGATIVE
HIV: NONREACTIVE

## 2014-05-11 LAB — URINALYSIS, MICROSCOPIC ONLY
BACTERIA UA: NONE SEEN
CASTS: NONE SEEN
CRYSTALS: NONE SEEN
SQUAMOUS EPITHELIAL / LPF: NONE SEEN

## 2014-05-12 LAB — URINE CULTURE

## 2014-05-13 LAB — HSV(HERPES SMPLX)ABS-I+II(IGG+IGM)-BLD
HSV 1 Glycoprotein G Ab, IgG: 2.59 IV — ABNORMAL HIGH
HSV 2 GLYCOPROTEIN G AB, IGG: 6.08 IV — AB
Herpes Simplex Vrs I&II-IgM Ab (EIA): 2.13 INDEX — ABNORMAL HIGH

## 2014-05-13 NOTE — Telephone Encounter (Signed)
Return call to [place] °

## 2014-05-13 NOTE — Telephone Encounter (Signed)
Patient states she has not had a Bone density scan in about 15 years.   Colonoscopy was done 01/25/2012 at [facility].  Called Howie Ill, Freda Munro faxed Colonoscopy report to [place].

## 2014-05-13 NOTE — Telephone Encounter (Signed)
Medical records from [facility] called back. Patient has not had a bone density with them at all.   Still waiting for pt to call me back for Colonoscopy

## 2014-05-14 LAB — IPS N GONORRHOEA AND CHLAMYDIA BY PCR

## 2014-05-15 LAB — IPS PAP TEST WITH HPV

## 2014-05-16 NOTE — Progress Notes (Signed)
Encounter reviewed by Dr. Brook Silva.  

## 2014-05-24 ENCOUNTER — Encounter (HOSPITAL_BASED_OUTPATIENT_CLINIC_OR_DEPARTMENT_OTHER): Payer: Self-pay | Admitting: Emergency Medicine

## 2014-05-24 ENCOUNTER — Observation Stay (HOSPITAL_BASED_OUTPATIENT_CLINIC_OR_DEPARTMENT_OTHER)
Admission: EM | Admit: 2014-05-24 | Discharge: 2014-05-25 | Disposition: A | Discharge status: Home / Self Care | Payer: BLUE CROSS/BLUE SHIELD | Attending: Internal Medicine | Admitting: Internal Medicine

## 2014-05-24 DIAGNOSIS — F1099 Alcohol use, unspecified with unspecified alcohol-induced disorder: Secondary | ICD-10-CM | POA: Diagnosis not present

## 2014-05-24 DIAGNOSIS — T783XXA Angioneurotic edema, initial encounter: Principal | ICD-10-CM | POA: Diagnosis present

## 2014-05-24 DIAGNOSIS — R220 Localized swelling, mass and lump, head: Secondary | ICD-10-CM | POA: Diagnosis present

## 2014-05-24 DIAGNOSIS — Z9851 Tubal ligation status: Secondary | ICD-10-CM | POA: Diagnosis not present

## 2014-05-24 DIAGNOSIS — Z91018 Allergy to other foods: Secondary | ICD-10-CM | POA: Diagnosis not present

## 2014-05-24 DIAGNOSIS — I10 Essential (primary) hypertension: Secondary | ICD-10-CM | POA: Diagnosis not present

## 2014-05-24 LAB — COMPREHENSIVE METABOLIC PANEL
ALT: 14 U/L (ref 0–35)
AST: 28 U/L (ref 0–37)
Albumin: 4.6 g/dL (ref 3.5–5.2)
Alkaline Phosphatase: 105 U/L (ref 39–117)
Anion gap: 7 (ref 5–15)
BUN: 15 mg/dL (ref 6–23)
CALCIUM: 9.3 mg/dL (ref 8.4–10.5)
CHLORIDE: 103 mmol/L (ref 96–112)
CO2: 30 mmol/L (ref 19–32)
CREATININE: 0.82 mg/dL (ref 0.50–1.10)
GFR calc non Af Amer: 77 mL/min — ABNORMAL LOW (ref >90)
GFR, EST AFRICAN AMERICAN: 90 mL/min — AB (ref >90)
GLUCOSE: 109 mg/dL — AB (ref 70–99)
Potassium: 3.7 mmol/L (ref 3.5–5.1)
SODIUM: 140 mmol/L (ref 135–145)
Total Bilirubin: <0.1 mg/dL — ABNORMAL LOW (ref 0.3–1.2)
Total Protein: 8.1 g/dL (ref 6.0–8.3)

## 2014-05-24 LAB — URINALYSIS, ROUTINE W REFLEX MICROSCOPIC
BILIRUBIN URINE: NEGATIVE
Glucose, UA: NEGATIVE mg/dL
Hgb urine dipstick: NEGATIVE
Ketones, ur: NEGATIVE mg/dL
LEUKOCYTES UA: NEGATIVE
NITRITE: NEGATIVE
PH: 6.5 (ref 5.0–8.0)
Protein, ur: NEGATIVE mg/dL
SPECIFIC GRAVITY, URINE: 1.01 (ref 1.005–1.030)
UROBILINOGEN UA: 0.2 mg/dL (ref 0.0–1.0)

## 2014-05-24 LAB — CBC
HCT: 37.7 % (ref 36.0–46.0)
HEMOGLOBIN: 12.6 g/dL (ref 12.0–15.0)
MCH: 32 pg (ref 26.0–34.0)
MCHC: 33.4 g/dL (ref 30.0–36.0)
MCV: 95.7 fL (ref 78.0–100.0)
Platelets: 331 10*3/uL (ref 150–400)
RBC: 3.94 MIL/uL (ref 3.87–5.11)
RDW: 12.7 % (ref 11.5–15.5)
WBC: 5.4 10*3/uL (ref 4.0–10.5)

## 2014-05-24 MED ORDER — METHYLPREDNISOLONE SODIUM SUCC 125 MG IJ SOLR
125.0000 mg | Freq: Once | INTRAMUSCULAR | Status: AC
  Administered 2014-05-24: 125 mg via INTRAVENOUS
  Filled 2014-05-24: qty 2

## 2014-05-24 MED ORDER — SODIUM CHLORIDE 0.9 % IV SOLN
INTRAVENOUS | Status: AC
  Administered 2014-05-24: 23:00:00 via INTRAVENOUS

## 2014-05-24 MED ORDER — EPINEPHRINE HCL 1 MG/ML IJ SOLN
0.3000 mg | Freq: Once | INTRAMUSCULAR | Status: AC
  Administered 2014-05-24: 0.3 mg via INTRAMUSCULAR
  Filled 2014-05-24: qty 1

## 2014-05-24 MED ORDER — DIPHENHYDRAMINE HCL 50 MG/ML IJ SOLN
25.0000 mg | Freq: Once | INTRAMUSCULAR | Status: AC
  Administered 2014-05-24: 25 mg via INTRAVENOUS
  Filled 2014-05-24: qty 1

## 2014-05-24 MED ORDER — EPINEPHRINE HCL 1 MG/ML IJ SOLN: 1.0000 mg | Freq: Once | INTRAMUSCULAR | Status: DC

## 2014-05-24 MED ORDER — FAMOTIDINE IN NACL 20-0.9 MG/50ML-% IV SOLN
20.0000 mg | Freq: Once | INTRAVENOUS | Status: AC
  Administered 2014-05-24: 20 mg via INTRAVENOUS
  Filled 2014-05-24: qty 50

## 2014-05-24 NOTE — ED Provider Notes (Signed)
CSN: [id number]     Arrival date & time 05/24/14  1856 History  This chart was scribed for Alfonzo Beers, MD by Peyton Bottoms, ED Scribe. This patient was seen in room MH03/MH03 and the patient's care was started at 7:25 PM.   Chief Complaint  Patient presents with  . Allergic Reaction   Patient is a 59 y.o. female presenting with allergic reaction. The history is provided by the patient. No language interpreter was used.  Allergic Reaction Presenting symptoms: difficulty swallowing and swelling   Presenting symptoms: no rash   Swelling:    Location: tongue.   Onset quality:  Sudden   Timing:  Constant   Progression:  Worsening   Chronicity:  Recurrent Severity:  Moderate Prior allergic episodes:  Unable to specify Relieved by:  Nothing Worsened by:  Nothing tried Ineffective treatments:  Antihistamines   HPI Comments: Tonantzin Mimnaugh is a 59 y.o. female with a PMHx of hypertension, angioedema and arthritis, who presents to the Emergency Department complaining of moderate tongue swelling that began at 3:30 PM earlier today. She reports prior hx of tongue swelling for the past 2 years and states that she is currently being seen by an allergy specialist. Patient states that she has increased swelling to the right side of the tongue compared to the left. She reports associated trouble swallowing. Patient states she took benadryl at 3:30 PM and at 5:30 PM today with no relief. Patient states that the swelling has gradually worsened since onset. She denies associated rash or hives today.  Past Medical History  Diagnosis Date  . Hypertension   . STD (sexually transmitted disease) 1989    History of GC/CHL  . Angio-edema   . Arthritis     Shoulder, Neck   Past Surgical History  Procedure Laterality Date  . Mass excision Right 07/18/2012    Procedure: MINOR EXCISION MASS DORSUM WITH DIP JOINT ARTHROTOMY RIGHT RING FINGER;  Surgeon: Cammie Sickle., MD;  Location: [place];  Service: Orthopedics;  Laterality: Right;  . Tubal ligation  08/1998   Family History  Problem Relation Age of Onset  . Heart disease Father   . Diabetes Maternal Grandmother   . Breast cancer Maternal Grandmother    History  Substance Use Topics  . Smoking status: Never Smoker   . Smokeless tobacco: Not on file  . Alcohol Use: 1.8 oz/week    3 Glasses of wine per week   OB History    Gravida Para Term Preterm AB TAB SAB Ectopic Multiple Living   4 2 2  2     2      Review of Systems  HENT: Positive for trouble swallowing.   Skin: Negative for rash.  All other systems reviewed and are negative.  Allergies  Soy allergy  Home Medications   Prior to Admission medications   Medication Sig Start Date End Date Taking? Authorizing Provider  diphenhydrAMINE (BENADRYL) 25 MG tablet Take 25-50 mg by mouth every 6 (six) hours as needed.    Yes Historical Provider, MD  Multiple Vitamin (MULTIVITAMIN) tablet Take 1 tablet by mouth daily.   Yes Historical Provider, MD  naproxen sodium (ANAPROX) 220 MG tablet Take 220 mg by mouth 2 (two) times daily with a meal.   Yes Historical Provider, MD  NORVASC 5 MG tablet Take 5 mg by mouth daily.  11/03/12  Yes Historical Provider, MD  ranitidine (ZANTAC) 150 MG capsule Take 150 mg by mouth 2 (two)  times daily. 05/01/14 05/31/14 Yes Historical Provider, MD  famotidine (PEPCID) 40 MG tablet Take 1 tablet (40 mg total) by mouth daily. 05/25/14   Reyne Dumas, MD  predniSONE (DELTASONE) 20 MG tablet Take 2 tablets (40 mg total) by mouth daily with breakfast. 05/25/14 05/29/14  Reyne Dumas, MD   Triage Vitals: BP 163/98 mmHg  Pulse 83  Temp(Src) 98.5 F (36.9 C) (Oral)  Resp 16  Ht 5\' 5"  (1.651 m)  Wt 150 lb (68.04 kg)  BMI 24.96 kg/m2  SpO2 100%  LMP 09/23/2011 Vitals reviewed Physical Exam  Physical Examination: General appearance - alert, well appearing, and in no distress Mental status - alert, oriented to person, place, and time Eyes -  no conjunctival injection ,no scleral icterus Mouth - MMM, tongue swelling with muffled voice, unable to see uvula or posterior OP, no lip swelling Neck - supple, no significant adenopathy Chest - clear to auscultation, no wheezes, rales or rhonchi, symmetric air entry Heart - normal rate, regular rhythm, normal S1, S2, no murmurs, rubs, clicks or gallops Extremities - peripheral pulses normal, no pedal edema, no clubbing or cyanosis Skin - normal coloration and turgor, no rashes  ED Course  Procedures (including critical care time)  DIAGNOSTIC STUDIES: Oxygen Saturation is 100% on RA, normal by my interpretation.    COORDINATION OF CARE: 7:30 PM- Discussed plans to order diagnostic solumedrol, Pepcid and Benadryl. Discussed with patient that she will need to be monitored for the next few hours to assure that swelling decreases. Pt advised of plan for treatment and pt agrees.  8:37 PM pt states she is feeling improved after meds, tongue swelling remains, but patient states she feels she is swallowing better.  Will continue to monitor.   10:16 PM pt is having very mild improvement, she continues to have significant tongue swelling, breathing and swallowing all right.  Swelling has stopped progressing.  Have discussed with patient that she will need admission- she is agreeable with this plan.    10:46 PM d/w Dr. Posey Pronto, triad for admission.  He accepts patient to telemetry observation  CRITICAL CARE Performed by: Threasa Beards Total critical care time: 35 Critical care time was exclusive of separately billable procedures and treating other patients. Critical care was necessary to treat or prevent imminent or life-threatening deterioration. Critical care was time spent personally by me on the following activities: development of treatment plan with patient and/or surrogate as well as nursing, discussions with consultants, evaluation of patient's response to treatment, examination of  patient, obtaining history from patient or surrogate, ordering and performing treatments and interventions, ordering and review of laboratory studies, ordering and review of radiographic studies, pulse oximetry and re-evaluation of patient's condition.  Labs Review Labs Reviewed  COMPREHENSIVE METABOLIC PANEL - Abnormal; Notable for the following:    Glucose, Bld 109 (*)    Total Bilirubin <0.1 (*)    GFR calc non Af Amer 77 (*)    GFR calc Af Amer 90 (*)    All other components within normal limits  COMPREHENSIVE METABOLIC PANEL - Abnormal; Notable for the following:    Glucose, Bld 152 (*)    GFR calc non Af Amer 75 (*)    GFR calc Af Amer 87 (*)    All other components within normal limits  CBC  URINALYSIS, ROUTINE W REFLEX MICROSCOPIC  CBC    Imaging Review No results found.   EKG Interpretation None     MDM   Final diagnoses:  Angioedema,  initial encounter    Pt with tongue swelling c/w angioedema.  Pt received IV solumedrol, benadryl, pepcid- swelling has stopped increasing after IV meds, however is not improving. Pt has muffled voice, she is protecting her airway and tolerating her secretions.  Pt admitted to triad for further management.    I personally performed the services described in this documentation, which was scribed in my presence. The recorded information has been reviewed and is accurate.   Alfonzo Beers, MD 05/25/14 [phone]

## 2014-05-24 NOTE — ED Notes (Signed)
Tongue swelling and foot bottom itching since 1530.  Airway clear.  Pt sts this has been happening every other month for approx 2 years.  Sts doctors have not been able to figure out why this is happening.  Took Benadryl at 1530 and another 90 min ago.

## 2014-05-25 ENCOUNTER — Encounter (HOSPITAL_COMMUNITY): Payer: Self-pay | Admitting: *Deleted

## 2014-05-25 DIAGNOSIS — I10 Essential (primary) hypertension: Secondary | ICD-10-CM | POA: Diagnosis not present

## 2014-05-25 DIAGNOSIS — T783XXA Angioneurotic edema, initial encounter: Secondary | ICD-10-CM | POA: Diagnosis not present

## 2014-05-25 LAB — COMPREHENSIVE METABOLIC PANEL
ALBUMIN: 4 g/dL (ref 3.5–5.2)
ALT: 14 U/L (ref 0–35)
ANION GAP: 6 (ref 5–15)
AST: 34 U/L (ref 0–37)
Alkaline Phosphatase: 109 U/L (ref 39–117)
BUN: 10 mg/dL (ref 6–23)
CALCIUM: 9.4 mg/dL (ref 8.4–10.5)
CO2: 26 mmol/L (ref 19–32)
Chloride: 108 mmol/L (ref 96–112)
Creatinine, Ser: 0.84 mg/dL (ref 0.50–1.10)
GFR calc Af Amer: 87 mL/min — ABNORMAL LOW (ref >90)
GFR, EST NON AFRICAN AMERICAN: 75 mL/min — AB (ref >90)
Glucose, Bld: 152 mg/dL — ABNORMAL HIGH (ref 70–99)
POTASSIUM: 4.4 mmol/L (ref 3.5–5.1)
SODIUM: 140 mmol/L (ref 135–145)
TOTAL PROTEIN: 7.5 g/dL (ref 6.0–8.3)
Total Bilirubin: 0.3 mg/dL (ref 0.3–1.2)

## 2014-05-25 LAB — CBC
HEMATOCRIT: 38.4 % (ref 36.0–46.0)
HEMOGLOBIN: 12.9 g/dL (ref 12.0–15.0)
MCH: 31.2 pg (ref 26.0–34.0)
MCHC: 33.6 g/dL (ref 30.0–36.0)
MCV: 93 fL (ref 78.0–100.0)
Platelets: 318 10*3/uL (ref 150–400)
RBC: 4.13 MIL/uL (ref 3.87–5.11)
RDW: 13 % (ref 11.5–15.5)
WBC: 5.1 10*3/uL (ref 4.0–10.5)

## 2014-05-25 MED ORDER — PREDNISONE 20 MG PO TABS: 40.0000 mg | ORAL_TABLET | Freq: Every day | ORAL | Status: AC

## 2014-05-25 MED ORDER — ACETAMINOPHEN 650 MG RE SUPP
650.0000 mg | Freq: Four times a day (QID) | RECTAL | Status: DC | PRN
Start: 2014-05-25 — End: 2014-05-25

## 2014-05-25 MED ORDER — FAMOTIDINE IN NACL 20-0.9 MG/50ML-% IV SOLN
20.0000 mg | Freq: Two times a day (BID) | INTRAVENOUS | Status: DC
  Administered 2014-05-25: 20 mg via INTRAVENOUS
  Filled 2014-05-25 (×3): qty 50

## 2014-05-25 MED ORDER — ACETAMINOPHEN 325 MG PO TABS: 650.0000 mg | ORAL_TABLET | Freq: Four times a day (QID) | ORAL | Status: DC | PRN

## 2014-05-25 MED ORDER — ONDANSETRON HCL 4 MG PO TABS: 4.0000 mg | ORAL_TABLET | Freq: Four times a day (QID) | ORAL | Status: DC | PRN

## 2014-05-25 MED ORDER — FAMOTIDINE 40 MG PO TABS: 40.0000 mg | ORAL_TABLET | Freq: Every day | ORAL | Status: DC

## 2014-05-25 MED ORDER — METHYLPREDNISOLONE SODIUM SUCC 125 MG IJ SOLR: 60.0000 mg | INTRAMUSCULAR | Status: DC

## 2014-05-25 MED ORDER — SODIUM CHLORIDE 0.9 % IJ SOLN
3.0000 mL | Freq: Two times a day (BID) | INTRAMUSCULAR | Status: DC
Start: 2014-05-25 — End: 2014-05-25

## 2014-05-25 MED ORDER — DIPHENHYDRAMINE HCL 50 MG/ML IJ SOLN: 12.5000 mg | Freq: Four times a day (QID) | INTRAMUSCULAR | Status: DC | PRN

## 2014-05-25 MED ORDER — AMLODIPINE BESYLATE 5 MG PO TABS
5.0000 mg | ORAL_TABLET | Freq: Every day | ORAL | Status: DC
  Filled 2014-05-25: qty 1

## 2014-05-25 MED ORDER — ONDANSETRON HCL 4 MG/2ML IJ SOLN: 4.0000 mg | Freq: Four times a day (QID) | INTRAMUSCULAR | Status: DC | PRN

## 2014-05-25 MED ORDER — METHYLPREDNISOLONE SODIUM SUCC 125 MG IJ SOLR
125.0000 mg | Freq: Once | INTRAMUSCULAR | Status: AC
  Administered 2014-05-25: 125 mg via INTRAVENOUS
  Filled 2014-05-25: qty 2

## 2014-05-25 MED ORDER — ENOXAPARIN SODIUM 40 MG/0.4ML ~~LOC~~ SOLN
40.0000 mg | SUBCUTANEOUS | Status: DC
  Administered 2014-05-25: 40 mg via SUBCUTANEOUS
  Filled 2014-05-25: qty 0.4

## 2014-05-25 NOTE — Progress Notes (Signed)
UR completed 

## 2014-05-25 NOTE — H&P (Signed)
[facility] History and Physical  Patient: Sheri Shelton  MRN: [id number]  DOB: 1955-03-20  DOS: the patient was seen and examined on 05/25/2014 PCP: Pcp Not In System  Chief Complaint: Tongue swelling  HPI: Sheri Shelton is a 59 y.o. female with Past medical history of hypertension, angioedema. The patient is presenting with complaints of tongue swelling. She has a history of angioedema and has been being worked up with [place]. Recently she was placed on Zantac. Patient mentions that today in the afternoon she started noticing that her tongue was getting progressively swollen she also has noted some swelling of her lips and therefore she came to the ER. After his initial treatment her swelling was not improving and therefore she was brought to the hospital. She does not have any difficult to swallowing her saliva) currently. She has a muffled voice. She does not have any difficulty breathing. No fever no chills. She is unable to identify and pinpoint a causative relation for her recurrent angioedema. She mentions is compliant with all medications.  The patient is coming from home. And at her baseline independent for most of her ADL.  Review of Systems: as mentioned in the history of present illness.  A Comprehensive review of the other systems is negative.  Past Medical History  Diagnosis Date  . Hypertension   . STD (sexually transmitted disease) 1989    History of GC/CHL  . Angio-edema   . Arthritis     Shoulder, Neck   Past Surgical History  Procedure Laterality Date  . Mass excision Right 07/18/2012    Procedure: MINOR EXCISION MASS DORSUM WITH DIP JOINT ARTHROTOMY RIGHT RING FINGER;  Surgeon: Cammie Sickle., MD;  Location: [facility];  Service: Orthopedics;  Laterality: Right;  . Tubal ligation  08/1998   Social History:  reports that she has never smoked. She does not have any smokeless tobacco history on file. She reports that she drinks about  1.8 oz of alcohol per week. She reports that she does not use illicit drugs.  Allergies  Allergen Reactions  . Soy Allergy Nausea And Vomiting    SOY MILK ONLY    Family History  Problem Relation Age of Onset  . Heart disease Father   . Diabetes Maternal Grandmother   . Breast cancer Maternal Grandmother     Prior to Admission medications   Medication Sig Start Date End Date Taking? Authorizing Provider  cholecalciferol (VITAMIN D) 1000 UNITS tablet Take 1,000 Units by mouth daily.    Historical Provider, MD  diphenhydrAMINE (BENADRYL) 25 MG tablet Take 50 mg by mouth every 6 (six) hours as needed.    Historical Provider, MD  famotidine (PEPCID) 20 MG tablet Take 1 tablet (20 mg total) by mouth 2 (two) times daily. Patient not taking: Reported on 05/10/2014 01/18/13   April Palumbo, MD  ibuprofen (ADVIL,MOTRIN) 200 MG tablet Take 200 mg by mouth every 6 (six) hours as needed.    Historical Provider, MD  meloxicam (MOBIC) 7.5 MG tablet Take 7.5 mg by mouth daily.    Historical Provider, MD  Multiple Vitamin (MULTIVITAMIN) tablet Take 1 tablet by mouth daily.    Historical Provider, MD  NORVASC 5 MG tablet daily. 11/03/12   Historical Provider, MD  predniSONE (DELTASONE) 20 MG tablet 3 tabs po day one, then 2 po daily x 4 days Patient not taking: Reported on 05/10/2014 01/18/13   April Palumbo, MD  ranitidine (ZANTAC) 150 MG capsule Take 150 mg  by mouth 2 (two) times daily.  05/01/14   Historical Provider, MD    Physical Exam: Filed Vitals:   05/25/14 0100 05/25/14 0212 05/25/14 0529 05/25/14 0531  BP: 123/84 144/87 132/88   Pulse: 72 75 83   Temp:  98.1 F (36.7 C) 97.7 F (36.5 C)   TempSrc:  Oral Oral   Resp: 16 15 15    Height:      Weight:    70.1 kg (154 lb 8.7 oz)  SpO2: 98% 97% 100%     General: Alert, Awake and Oriented to Time, Place and Person. Appear in mild distress Eyes: PERRL ENT: Oral Mucosa clear, mild swelling of the tongue, moist. Neck: no JVD no  stridor Cardiovascular: S1 and S2 Present, no Murmur, Peripheral Pulses Present Respiratory: Bilateral Air entry equal and Decreased, Clear to Auscultation, noCrackles, no wheezes Abdomen: Bowel Sound present, Soft and non tender Skin: no Rash Extremities: no Pedal edema, no calf tenderness Neurologic: Grossly no focal neuro deficit.  Labs on Admission:  CBC:  Recent Labs Lab 05/24/14 1915  WBC 5.4  HGB 12.6  HCT 37.7  MCV 95.7  PLT 331    CMP     Component Value Date/Time   NA 140 05/24/2014 1915   K 3.7 05/24/2014 1915   CL 103 05/24/2014 1915   CO2 30 05/24/2014 1915   GLUCOSE 109* 05/24/2014 1915   BUN 15 05/24/2014 1915   CREATININE 0.82 05/24/2014 1915   CALCIUM 9.3 05/24/2014 1915   PROT 8.1 05/24/2014 1915   ALBUMIN 4.6 05/24/2014 1915   AST 28 05/24/2014 1915   ALT 14 05/24/2014 1915   ALKPHOS 105 05/24/2014 1915   BILITOT <0.1* 05/24/2014 1915   GFRNONAA 77* 05/24/2014 1915   GFRAA 90* 05/24/2014 1915    No results for input(s): LIPASE, AMYLASE in the last 168 hours.  No results for input(s): CKTOTAL, CKMB, CKMBINDEX, TROPONINI in the last 168 hours. BNP (last 3 results) No results for input(s): BNP in the last 8760 hours.  ProBNP (last 3 results) No results for input(s): PROBNP in the last 8760 hours.   Radiological Exams on Admission: No results found.  Assessment/Plan Principal Problem:   Angioedema Active Problems:   Hypertension   1. Angioedema Patient presents with angioedema. Currently I would place her on Solu-Medrol as well as a Benadryl and Pepcid. Monitor on telemetry. Liquid diet at present.  2. Hypertension. Continue amlodipine.  Advance goals of care discussion: Full code  DVT Prophylaxis: subcutaneous Heparin. Nutrition: Liquid diet  Disposition: Admitted to observation in telemetry unit.  Author: Berle Mull, MD [facility] Pager: [phone] 05/25/2014, 6:32 AM    If 7PM-7AM, please contact  night-coverage [email or web address] Password [email or web address]

## 2014-05-25 NOTE — Discharge Summary (Signed)
Physician Discharge Summary  Sheri Shelton MRN: [id number] DOB/AGE: Nov 05, 1955 59 y.o.  PCP: Pcp Not In System   Admit date: 05/24/2014 Discharge date: 05/25/2014  Discharge Diagnoses:     Principal Problem:   Angioedema Active Problems:   Hypertension  Follow-up recommendations Follow-up with PCP in 5-7 days     Medication List    STOP taking these medications        ranitidine 150 MG capsule  Commonly known as:  ZANTAC      TAKE these medications        diphenhydrAMINE 25 MG tablet  Commonly known as:  BENADRYL  Take 25-50 mg by mouth every 6 (six) hours as needed.     famotidine 40 MG tablet  Commonly known as:  PEPCID  Take 1 tablet (40 mg total) by mouth daily.     multivitamin tablet  Take 1 tablet by mouth daily.     naproxen sodium 220 MG tablet  Commonly known as:  ANAPROX  Take 220 mg by mouth 2 (two) times daily with a meal.     NORVASC 5 MG tablet  Generic drug:  amLODipine  Take 5 mg by mouth daily.     predniSONE 20 MG tablet  Commonly known as:  DELTASONE  Take 2 tablets (40 mg total) by mouth daily with breakfast.        Discharge Condition: Stable   Disposition: 01-Home or Self Care   Consults: None   Significant Diagnostic Studies: No results found.    Microbiology: No results found for this or any previous visit (from the past 240 hour(s)).   Labs: Results for orders placed or performed during the hospital encounter of 05/24/14 (from the past 48 hour(s))  CBC     Status: None   Collection Time: 05/24/14  7:15 PM  Result Value Ref Range   WBC 5.4 4.0 - 10.5 K/uL   RBC 3.94 3.87 - 5.11 MIL/uL   Hemoglobin 12.6 12.0 - 15.0 g/dL   HCT 37.7 36.0 - 46.0 %   MCV 95.7 78.0 - 100.0 fL   MCH 32.0 26.0 - 34.0 pg   MCHC 33.4 30.0 - 36.0 g/dL   RDW 12.7 11.5 - 15.5 %   Platelets 331 150 - 400 K/uL  Comprehensive metabolic panel     Status: Abnormal   Collection Time: 05/24/14  7:15 PM  Result Value Ref Range   Sodium  140 135 - 145 mmol/L   Potassium 3.7 3.5 - 5.1 mmol/L   Chloride 103 96 - 112 mmol/L   CO2 30 19 - 32 mmol/L   Glucose, Bld 109 (H) 70 - 99 mg/dL   BUN 15 6 - 23 mg/dL   Creatinine, Ser 0.82 0.50 - 1.10 mg/dL   Calcium 9.3 8.4 - 10.5 mg/dL   Total Protein 8.1 6.0 - 8.3 g/dL   Albumin 4.6 3.5 - 5.2 g/dL   AST 28 0 - 37 U/L   ALT 14 0 - 35 U/L   Alkaline Phosphatase 105 39 - 117 U/L   Total Bilirubin <0.1 (L) 0.3 - 1.2 mg/dL   GFR calc non Af Amer 77 (L) >90 mL/min   GFR calc Af Amer 90 (L) >90 mL/min    Comment: (NOTE) The eGFR has been calculated using the CKD EPI equation. This calculation has not been validated in all clinical situations. eGFR's persistently <90 mL/min signify possible Chronic Kidney Disease.    Anion gap 7 5 - 15  Urinalysis, Routine w reflex microscopic     Status: None   Collection Time: 05/24/14 10:50 PM  Result Value Ref Range   Color, Urine YELLOW YELLOW   APPearance CLEAR CLEAR   Specific Gravity, Urine 1.010 1.005 - 1.030   pH 6.5 5.0 - 8.0   Glucose, UA NEGATIVE NEGATIVE mg/dL   Hgb urine dipstick NEGATIVE NEGATIVE   Bilirubin Urine NEGATIVE NEGATIVE   Ketones, ur NEGATIVE NEGATIVE mg/dL   Protein, ur NEGATIVE NEGATIVE mg/dL   Urobilinogen, UA 0.2 0.0 - 1.0 mg/dL   Nitrite NEGATIVE NEGATIVE   Leukocytes, UA NEGATIVE NEGATIVE    Comment: MICROSCOPIC NOT DONE ON URINES WITH NEGATIVE PROTEIN, BLOOD, LEUKOCYTES, NITRITE, OR GLUCOSE <1000 mg/dL.  Comprehensive metabolic panel     Status: Abnormal   Collection Time: 05/25/14  5:38 AM  Result Value Ref Range   Sodium 140 135 - 145 mmol/L   Potassium 4.4 3.5 - 5.1 mmol/L   Chloride 108 96 - 112 mmol/L   CO2 26 19 - 32 mmol/L   Glucose, Bld 152 (H) 70 - 99 mg/dL   BUN 10 6 - 23 mg/dL   Creatinine, Ser 0.84 0.50 - 1.10 mg/dL   Calcium 9.4 8.4 - 10.5 mg/dL   Total Protein 7.5 6.0 - 8.3 g/dL   Albumin 4.0 3.5 - 5.2 g/dL   AST 34 0 - 37 U/L   ALT 14 0 - 35 U/L   Alkaline Phosphatase 109 39 - 117  U/L   Total Bilirubin 0.3 0.3 - 1.2 mg/dL   GFR calc non Af Amer 75 (L) >90 mL/min   GFR calc Af Amer 87 (L) >90 mL/min    Comment: (NOTE) The eGFR has been calculated using the CKD EPI equation. This calculation has not been validated in all clinical situations. eGFR's persistently <90 mL/min signify possible Chronic Kidney Disease.    Anion gap 6 5 - 15  CBC     Status: None   Collection Time: 05/25/14  5:38 AM  Result Value Ref Range   WBC 5.1 4.0 - 10.5 K/uL   RBC 4.13 3.87 - 5.11 MIL/uL   Hemoglobin 12.9 12.0 - 15.0 g/dL   HCT 38.4 36.0 - 46.0 %   MCV 93.0 78.0 - 100.0 fL   MCH 31.2 26.0 - 34.0 pg   MCHC 33.6 30.0 - 36.0 g/dL   RDW 13.0 11.5 - 15.5 %   Platelets 318 150 - 400 K/uL     HPI :*Sheri Shelton is a 59 y.o. female with Past medical history of hypertension, angioedema. The patient is presenting with complaints of tongue swelling. She has a history of angioedema and has been being worked up with [place]. Recently she was placed on Zantac. Patient mentions that today in the afternoon she started noticing that her tongue was getting progressively swollen she also has noted some swelling of her lips and therefore she came to the ER. After his initial treatment her swelling was not improving and therefore she was brought to the hospital. She does not have any difficult to swallowing her saliva) currently. She has a muffled voice. She does not have any difficulty breathing. No fever no chills. She is unable to identify and pinpoint a causative relation for her recurrent angioedema. She mentions is compliant with all medications.  The patient is coming from home. And at her baseline independent for most of her ADL.  HOSPITAL COURSE: Angioedema Patient is followed at [facility] dr caldwell allergist  [place] Patient takes Zantac at home but states that it does not work Benadryl may help Patient presented with difficulty swallowing and responded  well to IV Solu-Medrol She received a total of 2 doses of IV Solu-Medrol 125 mg, Switched to prednisone 40 mg 5 days, then DC Continue Benadryl, Zantac has been DC'd and switchec to Pepcid If patient can tolerate a soft diet she will go home today   Discharge Exam:    Blood pressure 132/88, pulse 83, temperature 97.7 F (36.5 C), temperature source Oral, resp. rate 15, height 5' 5"  (1.651 m), weight 70.1 kg (154 lb 8.7 oz), last menstrual period 09/23/2011, SpO2 100 %. General: Alert, Awake and Oriented to Time, Place and Person. Appear in mild distress Eyes: PERRL ENT: Oral Mucosa clear, mild swelling of the tongue, moist. Neck: no JVD no stridor Cardiovascular: S1 and S2 Present, no Murmur, Peripheral Pulses Present Respiratory: Bilateral Air entry equal and Decreased, Clear to Auscultation, noCrackles, no wheezes Abdomen: Bowel Sound present, Soft and non tender Skin: no Rash Extremities: no Pedal edema, no calf tenderness Neurologic: Grossly no focal neuro deficit.        Discharge Instructions    Diet - low sodium heart healthy    Complete by:  As directed      Increase activity slowly    Complete by:  As directed              Signed: Nora Rooke 05/25/2014, 10:20 AM

## 2014-05-25 NOTE — Progress Notes (Signed)
Patient d/c home this afternoon, assessments remained unchanged prior to d/c. D/C instructions given and all questions answered.

## 2014-06-10 ENCOUNTER — Encounter (HOSPITAL_COMMUNITY): Payer: Self-pay

## 2014-06-10 ENCOUNTER — Observation Stay (HOSPITAL_COMMUNITY)
Admission: EM | Admit: 2014-06-10 | Discharge: 2014-06-10 | Payer: BLUE CROSS/BLUE SHIELD | Attending: Internal Medicine | Admitting: Internal Medicine

## 2014-06-10 DIAGNOSIS — I10 Essential (primary) hypertension: Secondary | ICD-10-CM | POA: Diagnosis not present

## 2014-06-10 DIAGNOSIS — Y929 Unspecified place or not applicable: Secondary | ICD-10-CM | POA: Diagnosis not present

## 2014-06-10 DIAGNOSIS — T783XXA Angioneurotic edema, initial encounter: Secondary | ICD-10-CM | POA: Diagnosis not present

## 2014-06-10 DIAGNOSIS — D649 Anemia, unspecified: Secondary | ICD-10-CM | POA: Diagnosis not present

## 2014-06-10 DIAGNOSIS — Y849 Medical procedure, unspecified as the cause of abnormal reaction of the patient, or of later complication, without mention of misadventure at the time of the procedure: Secondary | ICD-10-CM | POA: Insufficient documentation

## 2014-06-10 DIAGNOSIS — T889XXA Complication of surgical and medical care, unspecified, initial encounter: Secondary | ICD-10-CM | POA: Diagnosis not present

## 2014-06-10 LAB — BASIC METABOLIC PANEL
Anion gap: 9 (ref 5–15)
Anion gap: 10 (ref 5–15)
BUN: 12 mg/dL (ref 6–23)
BUN: 9 mg/dL (ref 6–23)
CHLORIDE: 105 mmol/L (ref 96–112)
CO2: 25 mmol/L (ref 19–32)
CO2: 24 mmol/L (ref 19–32)
CREATININE: 0.88 mg/dL (ref 0.50–1.10)
Calcium: 8.8 mg/dL (ref 8.4–10.5)
Calcium: 8.9 mg/dL (ref 8.4–10.5)
Chloride: 107 mmol/L (ref 96–112)
Creatinine, Ser: 0.92 mg/dL (ref 0.50–1.10)
GFR calc Af Amer: 78 mL/min — ABNORMAL LOW (ref >90)
GFR calc Af Amer: 82 mL/min — ABNORMAL LOW (ref >90)
GFR calc non Af Amer: 71 mL/min — ABNORMAL LOW (ref >90)
GFR, EST NON AFRICAN AMERICAN: 67 mL/min — AB (ref >90)
Glucose, Bld: 131 mg/dL — ABNORMAL HIGH (ref 70–99)
Glucose, Bld: 150 mg/dL — ABNORMAL HIGH (ref 70–99)
POTASSIUM: 3.9 mmol/L (ref 3.5–5.1)
Potassium: 3.8 mmol/L (ref 3.5–5.1)
SODIUM: 139 mmol/L (ref 135–145)
Sodium: 141 mmol/L (ref 135–145)

## 2014-06-10 LAB — CBC WITH DIFFERENTIAL/PLATELET
BASOS ABS: 0 10*3/uL (ref 0.0–0.1)
BASOS PCT: 0 % (ref 0–1)
Basophils Absolute: 0 10*3/uL (ref 0.0–0.1)
Basophils Relative: 0 % (ref 0–1)
EOS PCT: 0 % (ref 0–5)
Eosinophils Absolute: 0.2 10*3/uL (ref 0.0–0.7)
Eosinophils Absolute: 0 10*3/uL (ref 0.0–0.7)
Eosinophils Relative: 3 % (ref 0–5)
HCT: 34.9 % — ABNORMAL LOW (ref 36.0–46.0)
HCT: 35.3 % — ABNORMAL LOW (ref 36.0–46.0)
Hemoglobin: 11.6 g/dL — ABNORMAL LOW (ref 12.0–15.0)
Hemoglobin: 11.9 g/dL — ABNORMAL LOW (ref 12.0–15.0)
LYMPHS ABS: 0.7 10*3/uL (ref 0.7–4.0)
LYMPHS PCT: 28 % (ref 12–46)
LYMPHS PCT: 12 % (ref 12–46)
Lymphs Abs: 1.7 10*3/uL (ref 0.7–4.0)
MCH: 31.2 pg (ref 26.0–34.0)
MCH: 31.6 pg (ref 26.0–34.0)
MCHC: 33.2 g/dL (ref 30.0–36.0)
MCHC: 33.7 g/dL (ref 30.0–36.0)
MCV: 93.8 fL (ref 78.0–100.0)
MCV: 93.9 fL (ref 78.0–100.0)
MONO ABS: 0.5 10*3/uL (ref 0.1–1.0)
MONO ABS: 0.1 10*3/uL (ref 0.1–1.0)
MONOS PCT: 1 % — AB (ref 3–12)
Monocytes Relative: 8 % (ref 3–12)
NEUTROS ABS: 3.7 10*3/uL (ref 1.7–7.7)
Neutro Abs: 4.9 10*3/uL (ref 1.7–7.7)
Neutrophils Relative %: 61 % (ref 43–77)
Neutrophils Relative %: 87 % — ABNORMAL HIGH (ref 43–77)
PLATELETS: 231 10*3/uL (ref 150–400)
Platelets: 223 10*3/uL (ref 150–400)
RBC: 3.72 MIL/uL — ABNORMAL LOW (ref 3.87–5.11)
RBC: 3.76 MIL/uL — AB (ref 3.87–5.11)
RDW: 13.1 % (ref 11.5–15.5)
RDW: 13 % (ref 11.5–15.5)
WBC: 6 10*3/uL (ref 4.0–10.5)
WBC: 5.6 10*3/uL (ref 4.0–10.5)

## 2014-06-10 LAB — ABO/RH: ABO/RH(D): O POS

## 2014-06-10 LAB — TYPE AND SCREEN
ABO/RH(D): O POS
Antibody Screen: NEGATIVE

## 2014-06-10 LAB — GLUCOSE, CAPILLARY
Glucose-Capillary: 141 mg/dL — ABNORMAL HIGH (ref 70–99)
Glucose-Capillary: 153 mg/dL — ABNORMAL HIGH (ref 70–99)

## 2014-06-10 LAB — MRSA PCR SCREENING: MRSA BY PCR: NEGATIVE

## 2014-06-10 MED ORDER — ONDANSETRON HCL 4 MG PO TABS: 4.0000 mg | ORAL_TABLET | Freq: Four times a day (QID) | ORAL | Status: DC | PRN

## 2014-06-10 MED ORDER — ACETAMINOPHEN 325 MG PO TABS: 650.0000 mg | ORAL_TABLET | Freq: Four times a day (QID) | ORAL | Status: DC | PRN

## 2014-06-10 MED ORDER — HYDRALAZINE HCL 20 MG/ML IJ SOLN: 10.0000 mg | INTRAMUSCULAR | Status: DC | PRN

## 2014-06-10 MED ORDER — SODIUM CHLORIDE 0.9 % IV SOLN: INTRAVENOUS | Status: DC

## 2014-06-10 MED ORDER — ONDANSETRON HCL 4 MG/2ML IJ SOLN: 4.0000 mg | Freq: Four times a day (QID) | INTRAMUSCULAR | Status: DC | PRN

## 2014-06-10 MED ORDER — METHYLPREDNISOLONE SODIUM SUCC 125 MG IJ SOLR
125.0000 mg | Freq: Once | INTRAMUSCULAR | Status: AC
  Administered 2014-06-10: 125 mg via INTRAVENOUS
  Filled 2014-06-10: qty 2

## 2014-06-10 MED ORDER — DIPHENHYDRAMINE HCL 50 MG/ML IJ SOLN
25.0000 mg | Freq: Once | INTRAMUSCULAR | Status: AC
  Administered 2014-06-10: 25 mg via INTRAVENOUS
  Filled 2014-06-10: qty 1

## 2014-06-10 MED ORDER — FAMOTIDINE IN NACL 20-0.9 MG/50ML-% IV SOLN
20.0000 mg | Freq: Once | INTRAVENOUS | Status: AC
  Administered 2014-06-10: 20 mg via INTRAVENOUS
  Filled 2014-06-10: qty 50

## 2014-06-10 MED ORDER — ACETAMINOPHEN 650 MG RE SUPP: 650.0000 mg | Freq: Four times a day (QID) | RECTAL | Status: DC | PRN

## 2014-06-10 NOTE — H&P (Signed)
[facility] History and Physical  Eli Adami MPN:[id number] DOB: December 14, 1955 DOA: 06/10/2014  Referring physician: Dr. Sandrea Matte. PCP: Pcp Not In System  Specialists: Dr. Marcelline Deist, allergy specialist at [facility] can be reached at [phone].  Chief Complaint: Swelling of the tongue.  HPI: Sheri Shelton is a 59 y.o. female history of recurrent angioedema and has had recent admission for similar complaints presents to the ER because of swelling of the tongue. Patient's tongue started to swell suddenly at 1 AM. Patient denies any difficulty breathing. And patient did not have any stridor. In the ER patient was given Solu-Medrol Benadryl and Pepcid. Patient at this time states her swelling has decreased and she feels better. On exam patient still has significant swelling of the tongue. No stridor. Denies any new medications recently. Denies any insect bites. Patient has been following up with Dr. Marcelline Deist at [facility] for recurrent angioedema. At this time Dr. Marcelline Deist, allergy specialist feels that patient has bradykinin mediated angioedema and has workup being done. Patient will be admitted for further management. Patient denies any abdominal pain skin rash or itching at this time.  Review of Systems: As presented in the history of presenting illness, rest negative.  Past Medical History  Diagnosis Date  . Hypertension   . STD (sexually transmitted disease) 1989    History of GC/CHL  . Angio-edema   . Arthritis     Shoulder, Neck   Past Surgical History  Procedure Laterality Date  . Mass excision Right 07/18/2012    Procedure: MINOR EXCISION MASS DORSUM WITH DIP JOINT ARTHROTOMY RIGHT RING FINGER;  Surgeon: Cammie Sickle., MD;  Location: [place];  Service: Orthopedics;  Laterality: Right;  . Tubal ligation  08/1998   Social History:  reports that she has never smoked. She does not have any smokeless tobacco history on file. She reports that she  drinks about 1.8 oz of alcohol per week. She reports that she does not use illicit drugs. Where does patient live home. Can patient participate in ADLs? Yes.  Allergies  Allergen Reactions  . Soy Allergy Nausea And Vomiting    SOY MILK ONLY    Family History:  Family History  Problem Relation Age of Onset  . Heart disease Father   . Diabetes Maternal Grandmother   . Breast cancer Maternal Grandmother       Prior to Admission medications   Medication Sig Start Date End Date Taking? Authorizing Provider  diphenhydrAMINE (BENADRYL) 25 MG tablet Take 25-50 mg by mouth every 6 (six) hours as needed for allergies.    Yes Historical Provider, MD  famotidine (PEPCID) 40 MG tablet Take 1 tablet (40 mg total) by mouth daily. Patient taking differently: Take 40 mg by mouth daily as needed for heartburn.  05/25/14  Yes Reyne Dumas, MD  Multiple Vitamin (MULTIVITAMIN) tablet Take 1 tablet by mouth daily.   Yes Historical Provider, MD  naproxen sodium (ANAPROX) 220 MG tablet Take 220 mg by mouth 2 (two) times daily as needed (pain).    Yes Historical Provider, MD  NORVASC 5 MG tablet Take 5 mg by mouth daily.  11/03/12  Yes Historical Provider, MD    Physical Exam: Filed Vitals:   06/10/14 0327 06/10/14 0330 06/10/14 0400 06/10/14 0430  BP:  121/74 158/94 128/68  Pulse:  82 88 80  Resp:      Height:      Weight:      SpO2: 99% 98% 100% 99%  General:  Well-developed and nourished.  Eyes: Anicteric no pallor.  ENT: Tongue swelling seen. Difficult to examine the pharynx.  Neck: No stridor. No neck rigidity or mass felt.  Cardiovascular: S1 and S2 heard.  Respiratory: No rhonchi or crepitations.  Abdomen: Soft nontender bowel sounds present.  Skin: No rash.  Musculoskeletal: No edema.  Psychiatric: Appears normal.  Neurologic: Alert awake oriented to time place and person. Moves all extremities.  Labs on Admission:  Basic Metabolic Panel:  Recent Labs Lab  06/10/14 0340  NA 139  K 3.9  CL 105  CO2 25  GLUCOSE 131*  BUN 12  CREATININE 0.92  CALCIUM 8.8   Liver Function Tests: No results for input(s): AST, ALT, ALKPHOS, BILITOT, PROT, ALBUMIN in the last 168 hours. No results for input(s): LIPASE, AMYLASE in the last 168 hours. No results for input(s): AMMONIA in the last 168 hours. CBC:  Recent Labs Lab 06/10/14 0340  WBC 6.0  NEUTROABS 3.7  HGB 11.6*  HCT 34.9*  MCV 93.8  PLT 231   Cardiac Enzymes: No results for input(s): CKTOTAL, CKMB, CKMBINDEX, TROPONINI in the last 168 hours.  BNP (last 3 results) No results for input(s): BNP in the last 8760 hours.  ProBNP (last 3 results) No results for input(s): PROBNP in the last 8760 hours.  CBG: No results for input(s): GLUCAP in the last 168 hours.  Radiological Exams on Admission: No results found.    Assessment/Plan Principal Problem:   Angioedema Active Problems:   Hypertension   1. Angioedema - has been having recurrent episodes of angioedema since 2013. Patient is being followed by Dr. Marcelline Deist at [facility] and can be reached at [phone]. At this time patient has received Solu-Medrol Benadryl and Pepcid. As per Dr. 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 For now I have type and screen if in case patient needs FFP transfusion. May discuss with Dr. Marcelline Deist, patient's allergy specialist in a.m. for further recommendations. At this time will closely observe in stem diuretic and patient at this time feels her symptoms are improving. 2. Normocytic anemia - follow CBC and further workup as outpatient if there is no significant fall in hemoglobin. 3. Hypertension - since patient is kept nothing by mouth I have placed patient on when necessary IV hydralazine.   DVT Prophylaxis SCDs.  Code Status: Full code.  Family Communication: Patient's husband at the bedside.  Disposition  Plan: Admit for observation.    Yalena Colon N. [facility] Pager [phone].  If 7PM-7AM, please contact night-coverage [email or web address] Password [facility] 06/10/2014, 5:53 AM

## 2014-06-10 NOTE — Progress Notes (Signed)
UR completed 

## 2014-06-10 NOTE — Plan of Care (Signed)
Problem: Discharge Progression Outcomes Goal: Tolerating diet Outcome: Completed/Met Date Met:  06/10/14 Patient NPO

## 2014-06-10 NOTE — Discharge Summary (Signed)
PATIENT DETAILS Name: Sheri Shelton Age: 59 y.o. Sex: female Date of Birth: June 03, 1955 MRN: [id number]. Admitting Physician: Rise Patience, MD PCP:Pcp Not In System  Admit Date: 06/10/2014 Discharge date: 06/10/2014  Recommendations for Outpatient Follow-up:  1. Has recurrent idiopathic angioedema-being transferred to [facility] for further evaluation   PRIMARY DISCHARGE DIAGNOSIS:  Principal Problem:   Angioedema Active Problems:   Hypertension      PAST MEDICAL HISTORY: Past Medical History  Diagnosis Date  . Hypertension   . STD (sexually transmitted disease) 1989    History of GC/CHL  . Angio-edema   . Arthritis     Shoulder, Neck    DISCHARGE MEDICATIONS: Current Discharge Medication List    CONTINUE these medications which have NOT CHANGED   Details  diphenhydrAMINE (BENADRYL) 25 MG tablet Take 25-50 mg by mouth every 6 (six) hours as needed for allergies.     famotidine (PEPCID) 40 MG tablet Take 1 tablet (40 mg total) by mouth daily. Qty: 30 tablet, Refills: 2    Multiple Vitamin (MULTIVITAMIN) tablet Take 1 tablet by mouth daily.    naproxen sodium (ANAPROX) 220 MG tablet Take 220 mg by mouth 2 (two) times daily as needed (pain).     NORVASC 5 MG tablet Take 5 mg by mouth daily.         ALLERGIES:   Allergies  Allergen Reactions  . Soy Allergy Nausea And Vomiting    SOY MILK ONLY    BRIEF HPI:  See H&P, Labs, Consult and Test reports for all details in brief, patient was admitted for evaluation of tongue swelling.  CONSULTATIONS:   None  PERTINENT RADIOLOGIC STUDIES: No results found.   PERTINENT LAB RESULTS: CBC:  Recent Labs  06/10/14 0340 06/10/14 0649  WBC 6.0 5.6  HGB 11.6* 11.9*  HCT 34.9* 35.3*  PLT 231 223   CMET CMP     Component Value Date/Time   NA 141 06/10/2014 0649   K 3.8 06/10/2014 0649   CL 107 06/10/2014 0649   CO2 24 06/10/2014 0649   GLUCOSE 150* 06/10/2014 0649   BUN 9  06/10/2014 0649   CREATININE 0.88 06/10/2014 0649   CALCIUM 8.9 06/10/2014 0649   PROT 7.5 05/25/2014 0538   ALBUMIN 4.0 05/25/2014 0538   AST 34 05/25/2014 0538   ALT 14 05/25/2014 0538   ALKPHOS 109 05/25/2014 0538   BILITOT 0.3 05/25/2014 0538   GFRNONAA 71* 06/10/2014 0649   GFRAA 82* 06/10/2014 0649    GFR Estimated Creatinine Clearance: 68.4 mL/min (by C-G formula based on Cr of 0.88). No results for input(s): LIPASE, AMYLASE in the last 72 hours. No results for input(s): CKTOTAL, CKMB, CKMBINDEX, TROPONINI in the last 72 hours. Invalid input(s): POCBNP No results for input(s): DDIMER in the last 72 hours. No results for input(s): HGBA1C in the last 72 hours. No results for input(s): CHOL, HDL, LDLCALC, TRIG, CHOLHDL, LDLDIRECT in the last 72 hours. No results for input(s): TSH, T4TOTAL, T3FREE, THYROIDAB in the last 72 hours.  Invalid input(s): FREET3 No results for input(s): VITAMINB12, FOLATE, FERRITIN, TIBC, IRON, RETICCTPCT in the last 72 hours. Coags: No results for input(s): INR in the last 72 hours.  Invalid input(s): PT Microbiology: Recent Results (from the past 240 hour(s))  MRSA PCR Screening     Status: None   Collection Time: 06/10/14  6:25 AM  Result Value Ref Range Status   MRSA by PCR NEGATIVE NEGATIVE Final  Comment:        The GeneXpert MRSA Assay (FDA approved for NASAL specimens only), is one component of a comprehensive MRSA colonization surveillance program. It is not intended to diagnose MRSA infection nor to guide or monitor treatment for MRSA infections.      BRIEF HOSPITAL COURSE:   Principal Problem:   Idiopathic recurrent Angioedema: Second episode this month, fifth or sixth episode this year alone. Has had outpatient evaluation by allergist at [facility]. Patient was admitted and provided further evaluation.Subsequently this M.D. spoke with Dr. Corene Cornea Caldwell-patient's allergist at [facility], unfortunately extensive  outpatient workup has been negative. Dr. Marcelline Deist recommended use of Kalbitor or Frutoso Schatz, unfortunately we do not have a allergist in our facility. I consulted pulmonary critical care over at [facility], explained recommendations for use of above agents, patient was accepted as a transfer to [facility]. Patient continues to have tongue swelling, although mildly decreased since admission. Her upper airway remains  stable.  Active Problems:   Hypertension: Resume amlodipine on discharge from [place]:  Subjective:   [name] today has some tongue swelling, mildly improved than on admission  Objective:   Blood pressure 127/80, pulse 82, temperature 97.7 F (36.5 C), temperature source Axillary, resp. rate 13, height 5\' 5"  (1.651 m), weight 70 kg (154 lb 5.2 oz), last menstrual period 09/23/2011, SpO2 99 %.  Intake/Output Summary (Last 24 hours) at 06/10/14 1137 Last data filed at 06/10/14 0953  Gross per 24 hour  Intake    100 ml  Output      0 ml  Net    100 ml   Filed Weights   06/10/14 0325 06/10/14 0550  Weight: 68.04 kg (150 lb) 70 kg (154 lb 5.2 oz)    Exam Awake Alert, Oriented *3, No new F.N deficits, Normal affect. Speech slurred because of swollen tongue [place].AT,PERRAL Supple Neck,No JVD, No cervical lymphadenopathy appriciated.  Symmetrical Chest wall movement, Good air movement bilaterally, CTAB RRR,No Gallops,Rubs or new Murmurs, No Parasternal Heave +ve B.Sounds, Abd Soft, Non tender, No organomegaly appriciated, No rebound -guarding or rigidity. No Cyanosis, Clubbing or edema, No new Rash or bruise  DISCHARGE CONDITION: Stable  DISPOSITION: Transfer to [place]:    Activity:  As tolerated   Diet recommendation: Heart Healthy diet  Discharge Instructions    Diet - low sodium heart healthy    Complete by:  As directed      Increase activity slowly    Complete  by:  As directed            Follow-up Information    Please follow up.   Why:  Upon discharge from [place] information:   Primary care M.D.      Follow up In 1 week.      Total Time spent on discharge equals 45 minutes.  SignedOren Binet 06/10/2014 11:37 AM

## 2014-06-10 NOTE — ED Provider Notes (Signed)
CSN: [id number]     Arrival date & time 06/10/14  1324 History  This chart was scribed for Orpah Greek, MD by Eustaquio Maize, ED Scribe. This patient was seen in room A03C/A03C and the patient's care was started at 3:23 AM.    Chief Complaint  Patient presents with  . Oral Swelling   The history is provided by the patient. No language interpreter was used.     HPI Comments: Sheri Shelton is a 59 y.o. female who presents to the Emergency Department complaining of tongue swelling that began tonight around 1 AM, gradually worsening. Pt notes pain to the tongue as well. . Pt has had these symptoms on and off for the past 3 years. Husband notes that every time the symptoms appear, they are worse than before. She has been seen by an allergist and had multiple tests done with no specific cause found. Pt was seen in ED on 4/1 (2 weeks ago) for similar symptoms. Pt was admitted at that time. She denies shortness of breath or any other symptoms   Past Medical History  Diagnosis Date  . Hypertension   . STD (sexually transmitted disease) 1989    History of GC/CHL  . Angio-edema   . Arthritis     Shoulder, Neck   Past Surgical History  Procedure Laterality Date  . Mass excision Right 07/18/2012    Procedure: MINOR EXCISION MASS DORSUM WITH DIP JOINT ARTHROTOMY RIGHT RING FINGER;  Surgeon: Cammie Sickle., MD;  Location: [place];  Service: Orthopedics;  Laterality: Right;  . Tubal ligation  08/1998   Family History  Problem Relation Age of Onset  . Heart disease Father   . Diabetes Maternal Grandmother   . Breast cancer Maternal Grandmother    History  Substance Use Topics  . Smoking status: Never Smoker   . Smokeless tobacco: Not on file  . Alcohol Use: 1.8 oz/week    3 Glasses of wine per week   OB History    Gravida Para Term Preterm AB TAB SAB Ectopic Multiple Living   4 2 2  2     2      Review of Systems  HENT:       Positive for tongue swelling  and tongue pain.   Respiratory: Negative for shortness of breath.   All other systems reviewed and are negative.     Allergies  Soy allergy  Home Medications   Prior to Admission medications   Medication Sig Start Date End Date Taking? Authorizing Provider  diphenhydrAMINE (BENADRYL) 25 MG tablet Take 25-50 mg by mouth every 6 (six) hours as needed.     Historical Provider, MD  famotidine (PEPCID) 40 MG tablet Take 1 tablet (40 mg total) by mouth daily. 05/25/14   Reyne Dumas, MD  Multiple Vitamin (MULTIVITAMIN) tablet Take 1 tablet by mouth daily.    Historical Provider, MD  naproxen sodium (ANAPROX) 220 MG tablet Take 220 mg by mouth 2 (two) times daily with a meal.    Historical Provider, MD  NORVASC 5 MG tablet Take 5 mg by mouth daily.  11/03/12   Historical Provider, MD   Triage Vitals: BP 140/73 mmHg  Pulse 90  Resp 18  Ht 5\' 5"  (1.651 m)  Wt 150 lb (68.04 kg)  BMI 24.96 kg/m2  SpO2 99%  LMP 09/23/2011   Physical Exam  Constitutional: She is oriented to person, place, and time. She appears well-developed and well-nourished. No distress.  HENT:  Head: Normocephalic and atraumatic.  Right Ear: Hearing normal.  Left Ear: Hearing normal.  Nose: Nose normal.  Mouth/Throat: Oropharynx is clear and moist and mucous membranes are normal.  Significant swelling of the entire tongue.   Eyes: Conjunctivae and EOM are normal. Pupils are equal, round, and reactive to light.  Neck: Normal range of motion. Neck supple.  Cardiovascular: Normal rate, regular rhythm, S1 normal, S2 normal and normal heart sounds.  Exam reveals no gallop and no friction rub.   No murmur heard. Pulmonary/Chest: Effort normal and breath sounds normal. No respiratory distress. She exhibits no tenderness.  Abdominal: Soft. Normal appearance and bowel sounds are normal. There is no hepatosplenomegaly. There is no tenderness. There is no rebound, no guarding, no tenderness at McBurney's point and negative  Murphy's sign. No hernia.  Musculoskeletal: Normal range of motion.  Neurological: She is alert and oriented to person, place, and time. She has normal strength. No cranial nerve deficit or sensory deficit. Coordination normal. GCS eye subscore is 4. GCS verbal subscore is 5. GCS motor subscore is 6.  Skin: Skin is warm, dry and intact. No rash noted. No cyanosis.  Psychiatric: She has a normal mood and affect. Her speech is normal and behavior is normal. Thought content normal.  Nursing note and vitals reviewed.   ED Course  Procedures (including critical care time)  DIAGNOSTIC STUDIES: Oxygen Saturation is 99% on RA, normal by my interpretation.    COORDINATION OF CARE: 3:25 AM-Discussed treatment plan which includes Benadryl with pt at bedside and pt agreed to plan.   Labs Review Labs Reviewed - No data to display  Imaging Review No results found.   EKG Interpretation None      MDM   Final diagnoses:  None   angioedema  Patient presents to the ER for evaluation of tongue swelling. Patient reports that she has a history of recurrent angioedema, ongoing for the last 3 years. She reports that symptoms have been worsening recently. She does see an allergy specialist at [facility], no etiology or trigger for the angioedema has been identified. Patient presents with significant swelling of her tongue. Tongue is swollen, filling most of her oral cavity, but she is protecting her airway and is breathing comfortably. She is not hypoxic. Patient was administered Solu-Medrol, Benadryl, Pepcid. She has not had any progression of her swelling, but there has not been any improvement. Based on the severity of the swelling of the tongue, will recommend observation in the hospital.  I personally performed the services described in this documentation, which was scribed in my presence. The recorded information has been reviewed and is accurate.       Orpah Greek,  MD 06/10/14 [phone]

## 2014-06-10 NOTE — ED Notes (Signed)
Per Pt husband pt has hx x 3 years of oral swelling with no dx; pt's tongue started swelling around 1 am; Pt denies SOB but has pain in tongue and trouble swallowing. No other facial swelling noted; Pt unable to speak clear due to tongue swelling; Pt has been hospitalized x 1 for same problem.

## 2014-06-10 NOTE — Progress Notes (Signed)
Report given to Francina Ames, RN, [facility].  All questions answered.  Patient notified family.  [facility] notified. Patient without complaints.

## 2014-06-10 NOTE — Progress Notes (Signed)
Transferred to [facility] via [place].  No complaints.

## 2014-12-06 ENCOUNTER — Other Ambulatory Visit: Payer: Self-pay

## 2014-12-06 DIAGNOSIS — Z1231 Encounter for screening mammogram for malignant neoplasm of breast: Secondary | ICD-10-CM

## 2014-12-13 ENCOUNTER — Other Ambulatory Visit: Payer: Self-pay

## 2014-12-13 DIAGNOSIS — R600 Localized edema: Secondary | ICD-10-CM

## 2014-12-17 ENCOUNTER — Ambulatory Visit
Admission: RE | Admit: 2014-12-17 | Discharge: 2014-12-17 | Disposition: A | Discharge status: Home / Self Care | Payer: BLUE CROSS/BLUE SHIELD | Source: Ambulatory Visit

## 2014-12-17 DIAGNOSIS — R600 Localized edema: Secondary | ICD-10-CM

## 2014-12-19 ENCOUNTER — Ambulatory Visit: Payer: BLUE CROSS/BLUE SHIELD

## 2014-12-24 ENCOUNTER — Ambulatory Visit
Admission: RE | Admit: 2014-12-24 | Discharge: 2014-12-24 | Disposition: A | Discharge status: Home / Self Care | Payer: BLUE CROSS/BLUE SHIELD | Source: Ambulatory Visit

## 2014-12-24 DIAGNOSIS — Z1231 Encounter for screening mammogram for malignant neoplasm of breast: Secondary | ICD-10-CM

## 2014-12-31 ENCOUNTER — Ambulatory Visit
Admission: RE | Admit: 2014-12-31 | Discharge: 2014-12-31 | Disposition: A | Discharge status: Home / Self Care | Payer: BLUE CROSS/BLUE SHIELD | Source: Ambulatory Visit

## 2015-01-24 ENCOUNTER — Other Ambulatory Visit: Payer: Self-pay | Admitting: Orthopedic Surgery

## 2015-08-12 DIAGNOSIS — I10 Essential (primary) hypertension: Secondary | ICD-10-CM | POA: Diagnosis not present

## 2015-08-22 ENCOUNTER — Other Ambulatory Visit: Payer: Self-pay | Admitting: Orthopedic Surgery

## 2015-08-22 DIAGNOSIS — M67431 Ganglion, right wrist: Secondary | ICD-10-CM | POA: Diagnosis not present

## 2015-08-22 HISTORY — PX: WRIST SURGERY: SHX841

## 2015-08-29 DIAGNOSIS — I10 Essential (primary) hypertension: Secondary | ICD-10-CM | POA: Diagnosis not present

## 2015-08-29 DIAGNOSIS — D841 Defects in the complement system: Secondary | ICD-10-CM | POA: Diagnosis not present

## 2015-10-15 DIAGNOSIS — M19011 Primary osteoarthritis, right shoulder: Secondary | ICD-10-CM | POA: Diagnosis not present

## 2015-10-23 DIAGNOSIS — M67431 Ganglion, right wrist: Secondary | ICD-10-CM | POA: Diagnosis not present

## 2015-10-23 DIAGNOSIS — M6740 Ganglion, unspecified site: Secondary | ICD-10-CM | POA: Diagnosis not present

## 2015-11-12 DIAGNOSIS — M19011 Primary osteoarthritis, right shoulder: Secondary | ICD-10-CM | POA: Diagnosis not present

## 2015-11-25 DIAGNOSIS — M25531 Pain in right wrist: Secondary | ICD-10-CM | POA: Diagnosis not present

## 2015-11-25 DIAGNOSIS — M6740 Ganglion, unspecified site: Secondary | ICD-10-CM | POA: Diagnosis not present

## 2015-11-25 DIAGNOSIS — M67441 Ganglion, right hand: Secondary | ICD-10-CM | POA: Diagnosis not present

## 2015-12-18 DIAGNOSIS — Z23 Encounter for immunization: Secondary | ICD-10-CM | POA: Diagnosis not present

## 2015-12-19 ENCOUNTER — Encounter (INDEPENDENT_AMBULATORY_CARE_PROVIDER_SITE_OTHER): Payer: Self-pay | Admitting: Sports Medicine

## 2015-12-19 ENCOUNTER — Ambulatory Visit (INDEPENDENT_AMBULATORY_CARE_PROVIDER_SITE_OTHER): Payer: BLUE CROSS/BLUE SHIELD | Admitting: Sports Medicine

## 2015-12-19 VITALS — BP 140/88 | HR 71 | Ht 65.0 in | Wt 158.0 lb

## 2015-12-19 DIAGNOSIS — M79641 Pain in right hand: Secondary | ICD-10-CM | POA: Diagnosis not present

## 2015-12-19 DIAGNOSIS — M1990 Unspecified osteoarthritis, unspecified site: Secondary | ICD-10-CM

## 2015-12-19 LAB — CBC WITH DIFFERENTIAL/PLATELET
BASOS PCT: 1 %
Basophils Absolute: 47 cells/uL (ref 0–200)
Eosinophils Absolute: 188 cells/uL (ref 15–500)
Eosinophils Relative: 4 %
HCT: 40.7 % (ref 35.0–45.0)
Hemoglobin: 13.7 g/dL (ref 11.7–15.5)
Lymphocytes Relative: 43 %
Lymphs Abs: 2021 cells/uL (ref 850–3900)
MCH: 30.9 pg (ref 27.0–33.0)
MCHC: 33.7 g/dL (ref 32.0–36.0)
MCV: 91.7 fL (ref 80.0–100.0)
MONO ABS: 329 {cells}/uL (ref 200–950)
MONOS PCT: 7 %
MPV: 10.4 fL (ref 7.5–12.5)
Neutro Abs: 2115 cells/uL (ref 1500–7800)
Neutrophils Relative %: 45 %
PLATELETS: 339 10*3/uL (ref 140–400)
RBC: 4.44 MIL/uL (ref 3.80–5.10)
RDW: 14.1 % (ref 11.0–15.0)
WBC: 4.7 10*3/uL (ref 3.8–10.8)

## 2015-12-19 MED ORDER — DICLOFENAC SODIUM 2 % TD SOLN: 1.0000 "application " | Freq: Two times a day (BID) | TRANSDERMAL | 2 refills | Status: DC

## 2015-12-19 NOTE — Progress Notes (Signed)
Sheri Shelton - 60 y.o. female MRN [id number]  Date of birth: [birth year]  Office Visit Note: Visit Date: 12/19/2015 PCP: Pcp Not In System Referred by: No ref. provider found  Subjective: Chief Complaint  Patient presents with  . Right Wrist - Pain  . Left Hand - Pain  . Hand Pain    Left middle finger on left hand   HPI: States right wrist pain. Has cyst on right wrist and has had surgery on right wrist and fingers. Has shooting pain towards her right thumb.  Left middle is stiff, but can bend finger.  Has a brace on right wrist.  Here for a second opinion.  She has previously been told she has psoriatic arthritis by Dr. Burney Gauze was performed multiple surgeries on her fingers & hands. She has pain on most days it is worsened with activity. She's never had any type of autoimmune workup. She denies any significant foot ankle or other knee or joint pain. She's never had any issues with gout.  She denies any fevers chills recently gain or weight loss. No night sweats. No significant nighttime disturbance due to the hand pain.    ROS Otherwise per HPI.  Assessment & Plan: Visit Diagnoses:  1. Pain in right hand   2. Inflammatory arthropathy     Plan:   long discussion today with the patient. Given the significant changes in her hands & recent x-rays by Dr. Burney Gauze I do think further rheumatologic evaluation is warranted. We will go ahead & check some basic labs today but go ahead & refer her to Dr. Estanislado Pandy for evaluation & consideration of treatment for possible psoriatic arthritis.>50% of this 30 minute visit spent in direct patient counseling and/or coordination of care. Discussion was focused on education regarding the in discussing the pathoetiology and anticipated clinical course of the above condition.  Surgical intervention is absolutely an option for her regarding the cyst along the palmar aspect however we discussed the there is a high likelihood that this will recur & that  planning on intervention should purely be based on symptomatology & not overall appearance. I'm happy to see her back at any time & consider injection versus aspiration & injection but she reports only minimal pain currently.               Patient Instructions  We are going to refer you to Dr. Estanislado Pandy for evaluation for inflammatory arthritis. We will check some basic labs will be in touch if anything is up. I'm sending a prescription for a hand cream for you.   Meds & Orders:  Meds ordered this encounter  Medications  . Diclofenac Sodium (PENNSAID) 2 % SOLN    Sig: Place 1 application onto the skin 2 (two) times daily.    Dispense:  112 g    Refill:  2    Orders Placed This Encounter  Procedures  . Comprehensive metabolic panel  . CBC with Differential/Platelet  . VITAMIN D 25 Hydroxy (Vit-D Deficiency, Fractures)  . Sedimentation rate  . ANA  . Cyclic citrul peptide antibody, IgG  . Rheumatoid factor  . C-reactive protein  . Ambulatory referral to Rheumatology    Follow-up: Return for with Dr. Estanislado Pandy.   Procedures: No procedures performed  No notes on file   Clinical History: No additional findings.  She reports that she has never smoked. She has never used smokeless tobacco. No results for input(s): HGBA1C, LABURIC in the last 8760 hours.  Objective:  VS:  HT:5\' 5"  (165.1 cm)   WT:158 lb (71.7 kg)  BMI:26.3    BP:140/88  HR:71bpm  TEMP: ( )  RESP:  Physical Exam  Constitutional: No distress.  HENT:  Head: Normocephalic and atraumatic.  Eyes: Right eye exhibits no discharge. Left eye exhibits no discharge. No scleral icterus.  Pulmonary/Chest: Effort normal. No respiratory distress.  Musculoskeletal:  Significant osteoarthritic loss in bilateral hands. Large cyst along the radial aspect of the palmar wrist that is mucoid in character. She has significant osteoarthritic bossing of the MCPs & both DIP & DIPs. She has prior postsurgical incisions that are well  healed over the fingers as well as over this right-sided palmar cyst. No significant skin changes over the knees or elbows. No significant rash associated. She has artificial nails so nail evaluation was deferred.  Neurological: She is alert.  Appropriately interactive.  Skin: Skin is warm and dry. No rash noted. She is not diaphoretic. No erythema. No pallor.  Psychiatric: Judgment normal.    Ortho Exam Imaging: No results found.  Past Medical/Family/Surgical/Social History: Patient Active Problem List   Diagnosis Date Noted  . Angioedema 05/24/2014  . Hypertension    Past Medical History:  Diagnosis Date  . Angio-edema   . Arthritis    Shoulder, Neck  . Hypertension   . STD (sexually transmitted disease) 1989   History of GC/CHL   Family History  Problem Relation Age of Onset  . Heart disease Father   . Diabetes Maternal Grandmother   . Breast cancer Maternal Grandmother    Past Surgical History:  Procedure Laterality Date  . MASS EXCISION Right 07/18/2012   Procedure: MINOR EXCISION MASS DORSUM WITH DIP JOINT ARTHROTOMY RIGHT RING FINGER;  Surgeon: Cammie Sickle., MD;  Location: [place];  Service: Orthopedics;  Laterality: Right;  . TUBAL LIGATION  08/1998  . WRIST SURGERY  08/22/2015   Social History   Occupational History  . Not on file.   Social History Main Topics  . Smoking status: Never Smoker  . Smokeless tobacco: Never Used  . Alcohol use 1.8 oz/week    3 Glasses of wine per week  . Drug use: No  . Sexual activity: Yes    Partners: Male    Birth control/ protection: Post-menopausal

## 2015-12-19 NOTE — Patient Instructions (Signed)
We are going to refer you to Dr. Estanislado Pandy for evaluation for inflammatory arthritis. We will check some basic labs will be in touch if anything is up. I'm sending a prescription for a hand cream for you.

## 2015-12-19 NOTE — Pre-Procedure Instructions (Signed)
T

## 2015-12-20 LAB — VITAMIN D 25 HYDROXY (VIT D DEFICIENCY, FRACTURES): Vit D, 25-Hydroxy: 29 ng/mL — ABNORMAL LOW (ref 30–100)

## 2015-12-29 ENCOUNTER — Telehealth (INDEPENDENT_AMBULATORY_CARE_PROVIDER_SITE_OTHER): Payer: Self-pay

## 2015-12-29 ENCOUNTER — Telehealth (INDEPENDENT_AMBULATORY_CARE_PROVIDER_SITE_OTHER): Payer: Self-pay | Admitting: *Deleted

## 2015-12-29 NOTE — Telephone Encounter (Signed)
Ed with [facility] called stating that results did flow into our system and that we will need to reach out to our EMR vendor.  Advised him that 2 of 8 lab results came back for patient.  Stated that only 2 lab orders were put into system.  Advised me that we needed to speak with client services.  Talked with client services and she stated that she would call our lab tech at [address] to see what happened with the rest of the lab orders.   Stated she will return my call.

## 2015-12-29 NOTE — Telephone Encounter (Signed)
Received call from [facility] lab wanting to know if you more orders for this pt, only received 2 and should be 8, order were place on 10/274/17. Please call and ask to speak to [place]. [phone]

## 2015-12-31 NOTE — Telephone Encounter (Signed)
Talked with Sheri Shelton at [facility] and she stated that no other labs were put in , but 2 out of the 8.  Stated that 1 other lab was put in for 12/19/15, but would not be able to do any of the other labs being that blood work had already been discarded.

## 2016-01-07 ENCOUNTER — Telehealth (INDEPENDENT_AMBULATORY_CARE_PROVIDER_SITE_OTHER): Payer: Self-pay | Admitting: *Deleted

## 2016-01-07 NOTE — Telephone Encounter (Signed)
Pt called inquiring about her lab results that were done on 12/19/15, pt would like a call back about the results. I looked up her results and saw we are still waiting on rest of them. Please call pt back at [phone]

## 2016-01-08 NOTE — Telephone Encounter (Signed)
Talked with patient and advised her that only 2 lab test were resulted and the other 6 were not done.  Advised patient that she would have to have labs redrawn.

## 2016-01-09 ENCOUNTER — Other Ambulatory Visit: Payer: Self-pay | Admitting: Sports Medicine

## 2016-01-09 DIAGNOSIS — M79641 Pain in right hand: Secondary | ICD-10-CM | POA: Diagnosis not present

## 2016-01-09 DIAGNOSIS — M1990 Unspecified osteoarthritis, unspecified site: Secondary | ICD-10-CM | POA: Diagnosis not present

## 2016-01-09 LAB — COMPREHENSIVE METABOLIC PANEL
ALBUMIN: 4.2 g/dL (ref 3.6–5.1)
ALK PHOS: 105 U/L (ref 33–130)
ALT: 11 U/L (ref 6–29)
AST: 27 U/L (ref 10–35)
BUN: 13 mg/dL (ref 7–25)
CALCIUM: 9.2 mg/dL (ref 8.6–10.4)
CO2: 26 mmol/L (ref 20–31)
Chloride: 104 mmol/L (ref 98–110)
Creat: 0.91 mg/dL (ref 0.50–1.05)
GLUCOSE: 142 mg/dL — AB (ref 65–99)
POTASSIUM: 3.8 mmol/L (ref 3.5–5.3)
Sodium: 141 mmol/L (ref 135–146)
Total Bilirubin: 0.2 mg/dL (ref 0.2–1.2)
Total Protein: 7.2 g/dL (ref 6.1–8.1)

## 2016-01-10 LAB — SEDIMENTATION RATE: Sed Rate: 6 mm/hr (ref 0–30)

## 2016-01-12 LAB — RHEUMATOID FACTOR

## 2016-01-12 LAB — ANA: ANA: NEGATIVE

## 2016-01-12 LAB — CYCLIC CITRUL PEPTIDE ANTIBODY, IGG: Cyclic Citrullin Peptide Ab: <16 U

## 2016-01-12 LAB — C-REACTIVE PROTEIN: CRP: 5.8 mg/L (ref <8.0)

## 2016-01-20 ENCOUNTER — Telehealth (INDEPENDENT_AMBULATORY_CARE_PROVIDER_SITE_OTHER): Payer: Self-pay | Admitting: Sports Medicine

## 2016-01-20 DIAGNOSIS — M19011 Primary osteoarthritis, right shoulder: Secondary | ICD-10-CM | POA: Diagnosis not present

## 2016-01-20 MED ORDER — CHOLECALCIFEROL 1.25 MG (50000 UT) PO TABS: 1.0000 | ORAL_TABLET | ORAL | 0 refills | Status: DC

## 2016-01-20 NOTE — Telephone Encounter (Signed)
Discussed lab results with patient.  Normal rheumatologic labs are reassuring however given the history of psoriasis there is still concern for psoriatic arthritis & appreciate Dr. Estanislado Pandy evaluating her at her upcoming appointment.  We will go ahead & start her on 50,000 units vitamin D once weekly for 12 weeks. This will need to be rechecked at the end of treatment.

## 2016-01-21 ENCOUNTER — Other Ambulatory Visit: Payer: Self-pay

## 2016-01-21 DIAGNOSIS — M19042 Primary osteoarthritis, left hand: Secondary | ICD-10-CM | POA: Diagnosis not present

## 2016-01-21 DIAGNOSIS — M67442 Ganglion, left hand: Secondary | ICD-10-CM | POA: Diagnosis not present

## 2016-01-23 ENCOUNTER — Encounter: Payer: Self-pay | Admitting: Nurse Practitioner

## 2016-01-23 ENCOUNTER — Ambulatory Visit (INDEPENDENT_AMBULATORY_CARE_PROVIDER_SITE_OTHER): Payer: BLUE CROSS/BLUE SHIELD | Admitting: Nurse Practitioner

## 2016-01-23 VITALS — BP 134/90 | HR 68 | Ht 65.0 in | Wt 161.0 lb

## 2016-01-23 DIAGNOSIS — N760 Acute vaginitis: Secondary | ICD-10-CM | POA: Diagnosis not present

## 2016-01-23 MED ORDER — METRONIDAZOLE 0.75 % VA GEL: 1.0000 | Freq: Every day | VAGINAL | 0 refills | Status: DC

## 2016-01-23 MED ORDER — FLUCONAZOLE 150 MG PO TABS: 150.0000 mg | ORAL_TABLET | Freq: Once | ORAL | 0 refills | Status: AC

## 2016-01-23 NOTE — Patient Instructions (Signed)
Bacterial Vaginosis Bacterial vaginosis is a vaginal infection that occurs when the normal balance of bacteria in the vagina is disrupted. It results from an overgrowth of certain bacteria. This is the most common vaginal infection among women ages 15-44. Because bacterial vaginosis increases your risk for STIs (sexually transmitted infections), getting treated can help reduce your risk for chlamydia, gonorrhea, herpes, and HIV (human immunodeficiency virus). Treatment is also important for preventing complications in pregnant women, because this condition can cause an early (premature) delivery. What are the causes? This condition is caused by an increase in harmful bacteria that are normally present in small amounts in the vagina. However, the reason that the condition develops is not fully understood. What increases the risk? The following factors may make you more likely to develop this condition:  Having a new sexual partner or multiple sexual partners.  Having unprotected sex.  Douching.  Having an intrauterine device (IUD).  Smoking.  Drug and alcohol abuse.  Taking certain antibiotic medicines.  Being pregnant.  You cannot get bacterial vaginosis from toilet seats, bedding, swimming pools, or contact with objects around you. What are the signs or symptoms? Symptoms of this condition include:  Grey or white vaginal discharge. The discharge can also be watery or foamy.  A fish-like odor with discharge, especially after sexual intercourse or during menstruation.  Itching in and around the vagina.  Burning or pain with urination.  Some women with bacterial vaginosis have no signs or symptoms. How is this diagnosed? This condition is diagnosed based on:  Your medical history.  A physical exam of the vagina.  Testing a sample of vaginal fluid under a microscope to look for a large amount of bad bacteria or abnormal cells. Your health care provider may use a cotton swab  or a small wooden spatula to collect the sample.  How is this treated? This condition is treated with antibiotics. These may be given as a pill, a vaginal cream, or a medicine that is put into the vagina (suppository). If the condition comes back after treatment, a second round of antibiotics may be needed. Follow these instructions at home: Medicines  Take over-the-counter and prescription medicines only as told by your health care provider.  Take or use your antibiotic as told by your health care provider. Do not stop taking or using the antibiotic even if you start to feel better. General instructions  If you have a female sexual partner, tell her that you have a vaginal infection. She should see her health care provider and be treated if she has symptoms. If you have a female sexual partner, he does not need treatment.  During treatment: ? Avoid sexual activity until you finish treatment. ? Do not douche. ? Avoid alcohol as directed by your health care provider. ? Avoid breastfeeding as directed by your health care provider.  Drink enough water and fluids to keep your urine clear or pale yellow.  Keep the area around your vagina and rectum clean. ? Wash the area daily with warm water. ? Wipe yourself from front to back after using the toilet.  Keep all follow-up visits as told by your health care provider. This is important. How is this prevented?  Do not douche.  Wash the outside of your vagina with warm water only.  Use protection when having sex. This includes latex condoms and dental dams.  Limit how many sexual partners you have. To help prevent bacterial vaginosis, it is best to have sex with just   one partner (monogamous).  Make sure you and your sexual partner are tested for STIs.  Wear cotton or cotton-lined underwear.  Avoid wearing tight pants and pantyhose, especially during summer.  Limit the amount of alcohol that you drink.  Do not use any products that  contain nicotine or tobacco, such as cigarettes and e-cigarettes. If you need help quitting, ask your health care provider.  Do not use illegal drugs. Where to find more information:  Centers for Disease Control and Prevention: www.cdc.gov/std  American Sexual Health Association (ASHA): www.ashastd.org  U.S. Department of Health and Human Services, Office on Women's Health: www.womenshealth.gov/ or https://www.womenshealth.gov/a-z-topics/bacterial-vaginosis Contact a health care provider if:  Your symptoms do not improve, even after treatment.  You have more discharge or pain when urinating.  You have a fever.  You have pain in your abdomen.  You have pain during sex.  You have vaginal bleeding between periods. Summary  Bacterial vaginosis is a vaginal infection that occurs when the normal balance of bacteria in the vagina is disrupted.  Because bacterial vaginosis increases your risk for STIs (sexually transmitted infections), getting treated can help reduce your risk for chlamydia, gonorrhea, herpes, and HIV (human immunodeficiency virus). Treatment is also important for preventing complications in pregnant women, because the condition can cause an early (premature) delivery.  This condition is treated with antibiotic medicines. These may be given as a pill, a vaginal cream, or a medicine that is put into the vagina (suppository). This information is not intended to replace advice given to you by your health care provider. Make sure you discuss any questions you have with your health care provider. Document Released: 02/08/2005 Document Revised: 10/25/2015 Document Reviewed: 10/25/2015 Elsevier Interactive Patient Education  2017 Elsevier Inc.  

## 2016-01-23 NOTE — Progress Notes (Signed)
Patient ID: Sheri Shelton, female   DOB: [birth year], 60 y.o.   MRN: [id number]  60 y.o. Married [place] American female [phone] here with complaint of vaginal symptoms of burning, and increase discharge. Describes discharge as thin and odorous.  She used Monistat last week without help.   Onset of symptoms 14 days ago. Denies new personal products or vaginal dryness.    No STD concerns. Urinary symptoms none . Contraception is post menopausal.   O:  Healthy female WDWN Affect: normal, orientation x 3  Exam: no distress Abdomen:  Soft and non tender Lymph node: no enlargement or tenderness Pelvic exam: External genital: normal female BUS: negative Vagina: thin grey discharge noted.  Affirm taken.    A: R/O Vaginitis - most likely BV   P: Discussed findings of vaginitis and etiology. Discussed Aveeno or baking soda sitz bath for comfort. Avoid moist clothes or pads for extended period of time. If working out in gym clothes or swim suits for long periods of time change underwear or bottoms of swimsuit if possible. Olive Oil/Coconut Oil use for skin protection prior to activity can be used to external skin.  Rx: will go ahead and treat with Metrogel since symptomatic  Follow with [facility]  [facility] prn

## 2016-01-24 LAB — WET PREP BY MOLECULAR PROBE
CANDIDA SPECIES: NEGATIVE
Gardnerella vaginalis: POSITIVE — AB
Trichomonas vaginosis: NEGATIVE

## 2016-01-27 NOTE — Progress Notes (Signed)
Encounter reviewed by Dr. Brook Amundson C. Silva.  

## 2016-02-05 DIAGNOSIS — Z0000 Encounter for general adult medical examination without abnormal findings: Secondary | ICD-10-CM | POA: Diagnosis not present

## 2016-03-11 NOTE — Progress Notes (Signed)
Office Visit Note  Patient: Sheri Shelton             Date of Birth: June 02, 1955           MRN: [id number]             PCP: Kandice Hams, MD Referring: Gerda Diss, DO Visit Date: 03/12/2016 Occupation: Ecologist    Subjective:  Right shoulder pain   History of Present Illness: Sheri Shelton is a 61 y.o. female seen in consultation per request of Dr. Paulla Fore. 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 She underwent physical therapy and got better. She states gradually she started having ongoing pain in her neck and right shoulder. In 2014 she was seen by Dr. 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 The physical therapy again helped. She states she had intermittent injections in her shoulder 02/29/2012 and once in 2016. In 2017 due to increased pain in her right shoulder she had to cortisone injections to her right shoulder by Dr. Mardelle Matte. She also developed right wrist joint ganglion cyst which was resected in June 2017 but it recurred. She had a left third digit finger DIP joint mucinous cyst which was resected by Dr. Burney Gauze. She's been applying Pennsaid to those areas without much relief. She states she saw Dr. Paulla Fore in October 2017 with these multiple complaints and he she was referred here for further evaluation. She's been having some swelling in her right first PIP joint. She denies pain and swelling in any other joints.  Activities of Daily Living:  Patient reports morning stiffness for 5 minutes.   Patient Reports nocturnal pain.  Difficulty dressing/grooming: Denies Difficulty climbing stairs: Denies Difficulty getting out of chair: Denies Difficulty using hands for taps, buttons, cutlery, and/or writing: Denies   Review of Systems  Constitutional: Negative for fatigue, night sweats, weight gain, weight loss and  weakness.  HENT: Positive for mouth dryness. Negative for mouth sores, trouble swallowing, trouble swallowing and nose dryness.   Eyes: Negative for pain, redness, visual disturbance and dryness.  Respiratory: Negative for cough, shortness of breath and difficulty breathing.   Cardiovascular: Negative for chest pain, palpitations, hypertension, irregular heartbeat and swelling in legs/feet.  Gastrointestinal: Negative for blood in stool, constipation and diarrhea.  Endocrine: Negative for increased urination.  Genitourinary: Negative for vaginal dryness.  Musculoskeletal: Positive for arthralgias, joint pain and morning stiffness. Negative for joint swelling, myalgias, muscle weakness, muscle tenderness and myalgias.  Skin: Negative for color change, rash, hair loss, skin tightness, ulcers and sensitivity to sunlight.  Allergic/Immunologic: Negative for susceptible to infections.  Neurological: Negative for dizziness, memory loss and night sweats.  Hematological: Negative for swollen glands.  Psychiatric/Behavioral: Negative for depressed mood and sleep disturbance. The patient is not nervous/anxious.     PMFS History:  Patient Active Problem List   Diagnosis Date Noted  . Vitamin D deficiency 03/12/2016  . Ganglion cyst of volar aspect of right wrist 03/12/2016  . Angioedema 05/24/2014  . Hypertension     Past Medical History:  Diagnosis Date  . Angio-edema   . Arthritis    Shoulder, Neck  . Hypertension   . STD (sexually transmitted disease) 1989   History of GC/CHL    Family History  Problem Relation Age of Onset  . Heart disease Father   . Diabetes Maternal Grandmother   .  Breast cancer Maternal Grandmother   . Diabetes Mother    Past Surgical History:  Procedure Laterality Date  . MASS EXCISION Right 07/18/2012   Procedure: MINOR EXCISION MASS DORSUM WITH DIP JOINT ARTHROTOMY RIGHT RING FINGER;  Surgeon: Cammie Sickle., MD;  Location: [place];   Service: Orthopedics;  Laterality: Right;  . TUBAL LIGATION  08/1998  . WRIST SURGERY  08/22/2015   Social History   Social History Narrative  . No narrative on file     Objective: Vital Signs: BP 140/70 (BP Location: Left Arm, Patient Position: Sitting, Cuff Size: Normal)   Pulse 80   Resp 12   Ht _0  (1.651 m)   Wt 161 lb (73 kg)   LMP 09/23/2011   BMI 26.79 kg/m    Physical Exam  Constitutional: She is oriented to Shelton, place, and time. She appears well-developed and well-nourished.  HENT:  Head: Normocephalic and atraumatic.  Eyes: Conjunctivae and EOM are normal.  Neck: Normal range of motion.  Cardiovascular: Normal rate, regular rhythm, normal heart sounds and intact distal pulses.   Pulmonary/Chest: Effort normal and breath sounds normal.  Abdominal: Soft. Bowel sounds are normal.  Lymphadenopathy:    She has no cervical adenopathy.  Neurological: She is alert and oriented to Shelton, place, and time.  Skin: Skin is warm and dry. Capillary refill takes less than 2 seconds.  Psychiatric: She has a normal mood and affect. Her behavior is normal.  Nursing note and vitals reviewed.    Musculoskeletal Exam: C-spine stiffness with range of motion and thoracic and lumbar spine good range of motion with no discomfort she had painful range of motion of her right shoulder joint. Elbow joints wrist joints MCPs PIPs DIPs were good range of motion except for her right fourth PIP is fused. She had thickening of her right first PIP joint. She had resected mucinous cyst on her left third finger DIP joint with some scar tissue. She has fused right fourth PIP joint. She had recent cyst and thickening of her left fourth DIP joint. A ganglion cyst was noted on the volar aspect of her right wrist. Hip joints knee joints ankles MTPs PIPs with good range of motion she has a few calluses present on her feet.  CDAI Exam: No CDAI exam completed.    Investigation: Findings:  12/19/2015  CBC normal, vitamin D 29, 01/09/2016 ESR 6, CRP normal, CMP normal except glucose 142 ANA negative, RF less than 14, CCP less than 16    Imaging: [place] Extrem Up Bilat Comp  Result Date: 03/12/2016 Ultrasound examination of bilateral hands was performed per EULAR recommendations. Using 12 MHz transducer, grayscale and power Doppler bilateral second MCP joints, right first PIP joint, bilateral second third and fifth PIP and bilateral first second and fourth PIP joints and bilateral wrist joints both dorsal and volar aspects were evaluated to look for synovitis or tenosynovitis. The findings were there was no synovitis or tenosynovitis on ultrasound examination. There was no narrowing of all the PIP and DIP joints. The lleft median nerve was 0.14 cm squarescm squares which was more than upper limits of normal Impression: Ultrasound examination : The findings are consistent with severe osteoarthritis. No inflammatory arthritis was noted. Her left median nerve was enlarged although she wears not symptomatic.  Xr Cervical Spine 2 Or 3 Views  Result Date: 03/12/2016 Multilevel spondylosis with narrowing between C4-5, C5-6 and, C6-7. Most severe between C5-6. Anterior spurring some facet joint arthropathy  was noted. Impression: These findings are consistent with this disease of cervical spine  Xr Hand 2 View Left  Result Date: 03/12/2016 All PIP/DIP narrowing no MCP joint narrowing no intercarpal joint space narrowing no erosive changes these findings were consistent with osteoarthritis Impression: X-ray findings are consistent with mild osteoarthritis  Xr Hand 2 View Right  Result Date: 03/12/2016 Screw was noted in her right third finger DIP. All PIP/DIP's are narrow. Visit bony growth noted over right third phalanx. No MCP joint narrowing no intercarpal joint space narrowing no erosive changes no chondrocalcinosis was noted.  Xr Shoulder Right  Result Date: 03/12/2016 No glenohumeral joint space  narrowing was noted. She had a inferior humeral spur. Type II acromion. No chondrocalcinosis.   Speciality Comments: No specialty comments available.    Procedures:  No procedures performed Allergies: Soy allergy   Assessment / Plan:     Visit Diagnoses: Pain in both hands -she has had discomfort and intermittent swelling in her hands. On clinical examination she has some thickening of PIP/DIP joint and few mucinous cyst present which appears to be consistent with osteoarthritis I would obtain ultrasound in the future to look for any synovitis. It would be nice to see if one of those cysts were sent for pathological evaluation area and to complete the workup I will obtain following labs and x-rays. Plan: XR Hand 2 View Right, XR Hand 2 View Left, Angiotensin converting enzyme, Uric acid  Pain in joint of right shoulder - History of bone spur - Plan: XR Shoulder Right  DJD (degenerative joint disease), cervical - Reported by patient - Plan: XR Cervical Spine 2 or 3 views  Ganglion cyst of volar aspect of right wrist - Resected June 2017 with  recurrence  Essential hypertension  Angioedema, subsequent encounter  Vitamin D deficiency : Advised to take over-the-counter vitamin D.   Orders: Orders Placed This Encounter  Procedures  . XR Shoulder Right  . XR Hand 2 View Right  . XR Hand 2 View Left  . XR Cervical Spine 2 or 3 views  . [place] Extrem Up Bilat Comp  . Angiotensin converting enzyme  . Uric acid   No orders of the defined types were placed in this encounter.   Face-to-face time spent with patient was 60 minutes. 50% of time was spent in counseling and coordination of care.  Follow-Up Instructions: Return for Osteoarthritis.   Bo Merino, MD  Note - This record has been created using Editor, commissioning.  Chart creation errors have been sought, but may not always  have been located. Such creation errors do not reflect on  the standard of medical care.

## 2016-03-12 ENCOUNTER — Ambulatory Visit (INDEPENDENT_AMBULATORY_CARE_PROVIDER_SITE_OTHER): Payer: BLUE CROSS/BLUE SHIELD | Admitting: Rheumatology

## 2016-03-12 ENCOUNTER — Ambulatory Visit (INDEPENDENT_AMBULATORY_CARE_PROVIDER_SITE_OTHER): Payer: Self-pay

## 2016-03-12 ENCOUNTER — Inpatient Hospital Stay (INDEPENDENT_AMBULATORY_CARE_PROVIDER_SITE_OTHER): Payer: Self-pay

## 2016-03-12 ENCOUNTER — Encounter: Payer: Self-pay | Admitting: Rheumatology

## 2016-03-12 VITALS — BP 140/70 | HR 80 | Resp 12 | Ht 65.0 in | Wt 161.0 lb

## 2016-03-12 DIAGNOSIS — I10 Essential (primary) hypertension: Secondary | ICD-10-CM | POA: Diagnosis not present

## 2016-03-12 DIAGNOSIS — M5030 Other cervical disc degeneration, unspecified cervical region: Secondary | ICD-10-CM

## 2016-03-12 DIAGNOSIS — M79641 Pain in right hand: Secondary | ICD-10-CM | POA: Diagnosis not present

## 2016-03-12 DIAGNOSIS — T783XXD Angioneurotic edema, subsequent encounter: Secondary | ICD-10-CM

## 2016-03-12 DIAGNOSIS — M67431 Ganglion, right wrist: Secondary | ICD-10-CM | POA: Insufficient documentation

## 2016-03-12 DIAGNOSIS — M79642 Pain in left hand: Secondary | ICD-10-CM

## 2016-03-12 DIAGNOSIS — M25511 Pain in right shoulder: Secondary | ICD-10-CM | POA: Diagnosis not present

## 2016-03-12 DIAGNOSIS — E559 Vitamin D deficiency, unspecified: Secondary | ICD-10-CM | POA: Insufficient documentation

## 2016-03-12 DIAGNOSIS — M47812 Spondylosis without myelopathy or radiculopathy, cervical region: Secondary | ICD-10-CM

## 2016-03-12 NOTE — Patient Instructions (Signed)
Supplements for OA Natural anti-inflammatories  You can purchase these at [employer], [employer] or online.  . Turmeric (capsules)  . Ginger (ginger root or capsules)  . Omega 3 (Fish, flax seeds, chia seeds, walnuts, almonds)  . Tart cherry (dried or extract)   Patient should be under the care of a physician while taking these supplements. This may not be reproduced without the permission of Dr. Adaja Wander.  

## 2016-03-13 LAB — URIC ACID: URIC ACID, SERUM: 6.6 mg/dL (ref 2.5–7.0)

## 2016-03-15 LAB — ANGIOTENSIN CONVERTING ENZYME: ANGIOTENSIN-CONVERTING ENZYME: 46 U/L (ref 9–67)

## 2016-03-15 NOTE — Progress Notes (Signed)
Labs normal.

## 2016-04-09 DIAGNOSIS — Z0000 Encounter for general adult medical examination without abnormal findings: Secondary | ICD-10-CM | POA: Diagnosis not present

## 2016-04-13 DIAGNOSIS — M47812 Spondylosis without myelopathy or radiculopathy, cervical region: Secondary | ICD-10-CM | POA: Insufficient documentation

## 2016-04-13 NOTE — Progress Notes (Deleted)
Office Visit Note  Patient: Sheri Shelton             Date of Birth: 1955/12/15           MRN: [id number]             PCP: Kandice Hams, MD Referring: No ref. provider found Visit Date: 04/20/2016 Occupation: '@GUAROCC'$ @    Subjective:  No chief complaint on file.   History of Present Illness: Sheri Shelton is a 61 y.o. female ***   Activities of Daily Living:  Patient reports morning stiffness for *** {minute/hour:19697}.   Patient {ACTIONS;DENIES/REPORTS:21021675::"Denies"} nocturnal pain.  Difficulty dressing/grooming: {ACTIONS;DENIES/REPORTS:21021675::"Denies"} Difficulty climbing stairs: {ACTIONS;DENIES/REPORTS:21021675::"Denies"} Difficulty getting out of chair: {ACTIONS;DENIES/REPORTS:21021675::"Denies"} Difficulty using hands for taps, buttons, cutlery, and/or writing: {ACTIONS;DENIES/REPORTS:21021675::"Denies"}   No Rheumatology ROS completed.   PMFS History:  Patient Active Problem List   Diagnosis Date Noted  . DJD (degenerative joint disease), cervical 04/13/2016  . Vitamin D deficiency 03/12/2016  . Ganglion cyst of volar aspect of right wrist 03/12/2016  . Angioedema 05/24/2014  . Hypertension     Past Medical History:  Diagnosis Date  . Angio-edema   . Arthritis    Shoulder, Neck  . Hypertension   . STD (sexually transmitted disease) 1989   History of GC/CHL    Family History  Problem Relation Age of Onset  . Heart disease Father   . Diabetes Maternal Grandmother   . Breast cancer Maternal Grandmother   . Diabetes Mother    Past Surgical History:  Procedure Laterality Date  . MASS EXCISION Right 07/18/2012   Procedure: MINOR EXCISION MASS DORSUM WITH DIP JOINT ARTHROTOMY RIGHT RING FINGER;  Surgeon: Cammie Sickle., MD;  Location: [place];  Service: Orthopedics;  Laterality: Right;  . TUBAL LIGATION  08/1998  . WRIST SURGERY  08/22/2015   Social History   Social History Narrative  . No narrative on file      Objective: Vital Signs: LMP 10/12/2011    Physical Exam   Musculoskeletal Exam: ***  CDAI Exam: No CDAI exam completed.    Investigation: Findings:  12/19/2015 CBC normal, vitamin D 29, 01/09/2016 ESR 6, CRP normal, CMP normal except glucose 142 ANA negative, RF less than 14, CCP less than 16 03/12/2016 uric acid 6.6, Ace normal  Office Visit on 03/12/2016  Component Date Value Ref Range Status  . Angiotensin-Converting Enzyme 03/12/2016 46  9 - 67 U/L Final   Comment: ** Please note change in reference range(s). **     . Uric Acid, Serum 03/12/2016 6.6  2.5 - 7.0 mg/dL Final     Imaging: No results found.  Speciality Comments: No specialty comments available.    Procedures:  No procedures performed Allergies: Soy allergy   Assessment / Plan:     Visit Diagnoses: Chronic right shoulder pain  Bilateral hand pain - ANA negative, RF negative, CCP negative, x-rays consistent with osteoarthritis  DJD (degenerative joint disease), cervical  Vitamin D deficiency  Angioedema, subsequent encounter  History of hypertension    Orders: No orders of the defined types were placed in this encounter.  No orders of the defined types were placed in this encounter.   Face-to-face time spent with patient was *** minutes. 50% of time was spent in counseling and coordination of care.  Follow-Up Instructions: No Follow-up on file.   Bo Merino, MD  Note - This record has been created using Editor, commissioning.  Chart creation errors have been sought, but may  not always  have been located. Such creation errors do not reflect on  the standard of medical care.

## 2016-04-20 ENCOUNTER — Ambulatory Visit: Payer: BLUE CROSS/BLUE SHIELD | Admitting: Rheumatology

## 2016-04-20 ENCOUNTER — Telehealth: Payer: Self-pay | Admitting: Rheumatology

## 2016-04-20 NOTE — Telephone Encounter (Signed)
Patient cancelled her NPT f/u and states she will call back to r/s when she is ready to see Dr. Estanislado Pandy again. Patient is requesting a call about lab results and a copy of her labs be mailed to her home address please.

## 2016-04-21 ENCOUNTER — Other Ambulatory Visit (INDEPENDENT_AMBULATORY_CARE_PROVIDER_SITE_OTHER): Payer: Self-pay | Admitting: Sports Medicine

## 2016-04-23 NOTE — Telephone Encounter (Signed)
Attempted to contact the patient and left message for patient to call the office.  

## 2016-04-27 NOTE — Telephone Encounter (Signed)
Attempted to contact the patient and left message for patient to call the office.  

## 2016-04-30 ENCOUNTER — Encounter: Payer: Self-pay | Admitting: *Deleted

## 2016-04-30 NOTE — Telephone Encounter (Signed)
Attempted to contact patient several times. Patient never returned calls. Lab results printed and a letter mailed to the patient stating if she would like to discuss her lab results in detail to call the office to reschedule her new patient follow up. Patient cancelled her new patient follow up which was schedule for 04/20/16.

## 2016-05-18 DIAGNOSIS — T783XXD Angioneurotic edema, subsequent encounter: Secondary | ICD-10-CM | POA: Diagnosis not present

## 2016-05-18 DIAGNOSIS — Z79899 Other long term (current) drug therapy: Secondary | ICD-10-CM | POA: Diagnosis not present

## 2016-06-23 DIAGNOSIS — M19011 Primary osteoarthritis, right shoulder: Secondary | ICD-10-CM | POA: Diagnosis not present

## 2016-07-09 DIAGNOSIS — M19011 Primary osteoarthritis, right shoulder: Secondary | ICD-10-CM | POA: Diagnosis not present

## 2016-07-09 DIAGNOSIS — M67911 Unspecified disorder of synovium and tendon, right shoulder: Secondary | ICD-10-CM | POA: Diagnosis not present

## 2016-08-16 DIAGNOSIS — M7541 Impingement syndrome of right shoulder: Secondary | ICD-10-CM | POA: Diagnosis not present

## 2016-08-16 DIAGNOSIS — M94211 Chondromalacia, right shoulder: Secondary | ICD-10-CM | POA: Diagnosis not present

## 2016-08-16 DIAGNOSIS — M7551 Bursitis of right shoulder: Secondary | ICD-10-CM | POA: Diagnosis not present

## 2016-08-16 DIAGNOSIS — G8918 Other acute postprocedural pain: Secondary | ICD-10-CM | POA: Diagnosis not present

## 2016-08-16 DIAGNOSIS — M24111 Other articular cartilage disorders, right shoulder: Secondary | ICD-10-CM | POA: Diagnosis not present

## 2016-08-16 DIAGNOSIS — S43491A Other sprain of right shoulder joint, initial encounter: Secondary | ICD-10-CM | POA: Diagnosis not present

## 2016-08-23 DIAGNOSIS — Z9889 Other specified postprocedural states: Secondary | ICD-10-CM | POA: Diagnosis not present

## 2016-08-24 DIAGNOSIS — M25611 Stiffness of right shoulder, not elsewhere classified: Secondary | ICD-10-CM | POA: Diagnosis not present

## 2016-08-24 DIAGNOSIS — M25511 Pain in right shoulder: Secondary | ICD-10-CM | POA: Diagnosis not present

## 2016-08-31 DIAGNOSIS — M25611 Stiffness of right shoulder, not elsewhere classified: Secondary | ICD-10-CM | POA: Diagnosis not present

## 2016-08-31 DIAGNOSIS — M25511 Pain in right shoulder: Secondary | ICD-10-CM | POA: Diagnosis not present

## 2016-09-29 DIAGNOSIS — Z9889 Other specified postprocedural states: Secondary | ICD-10-CM | POA: Diagnosis not present

## 2016-12-15 DIAGNOSIS — T783XXD Angioneurotic edema, subsequent encounter: Secondary | ICD-10-CM | POA: Diagnosis not present

## 2016-12-15 DIAGNOSIS — Z79899 Other long term (current) drug therapy: Secondary | ICD-10-CM | POA: Diagnosis not present

## 2016-12-23 DIAGNOSIS — M6740 Ganglion, unspecified site: Secondary | ICD-10-CM | POA: Diagnosis not present

## 2017-01-12 ENCOUNTER — Other Ambulatory Visit: Payer: Self-pay

## 2017-01-12 DIAGNOSIS — M19042 Primary osteoarthritis, left hand: Secondary | ICD-10-CM | POA: Diagnosis not present

## 2017-01-12 DIAGNOSIS — M6740 Ganglion, unspecified site: Secondary | ICD-10-CM | POA: Diagnosis not present

## 2017-01-12 DIAGNOSIS — M67442 Ganglion, left hand: Secondary | ICD-10-CM | POA: Diagnosis not present

## 2017-01-12 DIAGNOSIS — L729 Follicular cyst of the skin and subcutaneous tissue, unspecified: Secondary | ICD-10-CM | POA: Diagnosis not present

## 2017-01-17 DIAGNOSIS — M79645 Pain in left finger(s): Secondary | ICD-10-CM | POA: Diagnosis not present

## 2017-02-17 DIAGNOSIS — M67441 Ganglion, right hand: Secondary | ICD-10-CM | POA: Diagnosis not present

## 2017-02-17 DIAGNOSIS — M25531 Pain in right wrist: Secondary | ICD-10-CM | POA: Diagnosis not present

## 2017-02-28 IMAGING — US US EXTREM LOW VENOUS BILAT
1 series · 13 of 24 positions shown · non-contrast
Comparison: None.

CLINICAL DATA: 59-year-old female with a history of swelling



[Series 1: us extrem low venous bilat · 13 of 53 slices shown]
[im 1/53]
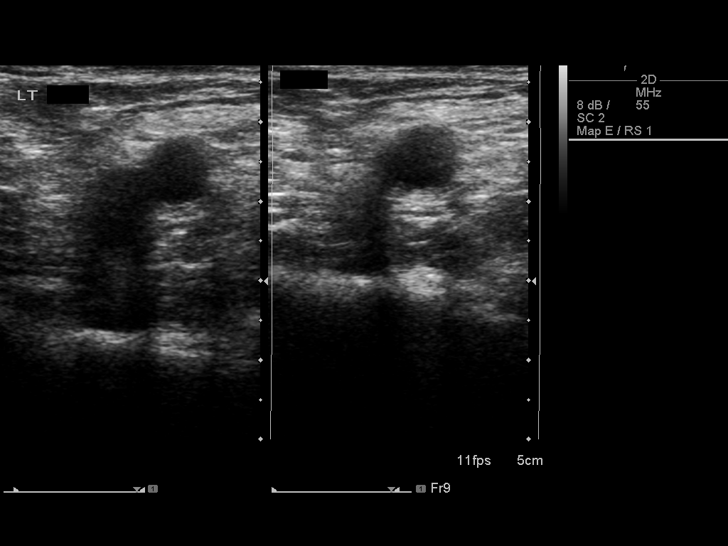
[im 5/53]
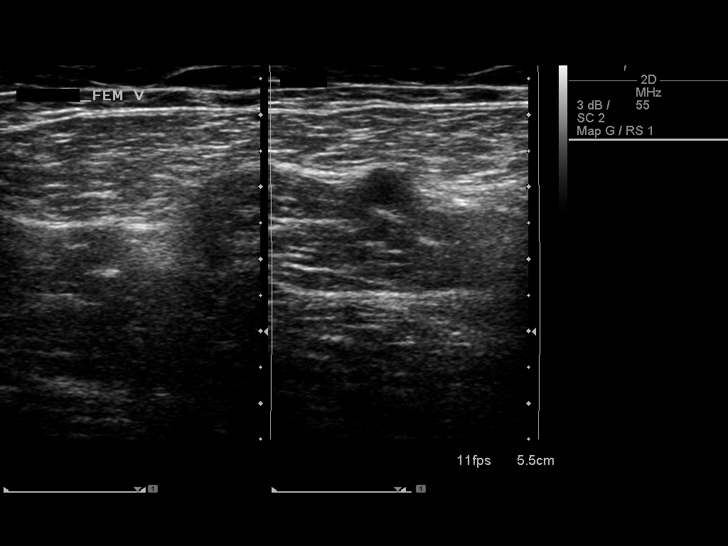
[im 10/53]
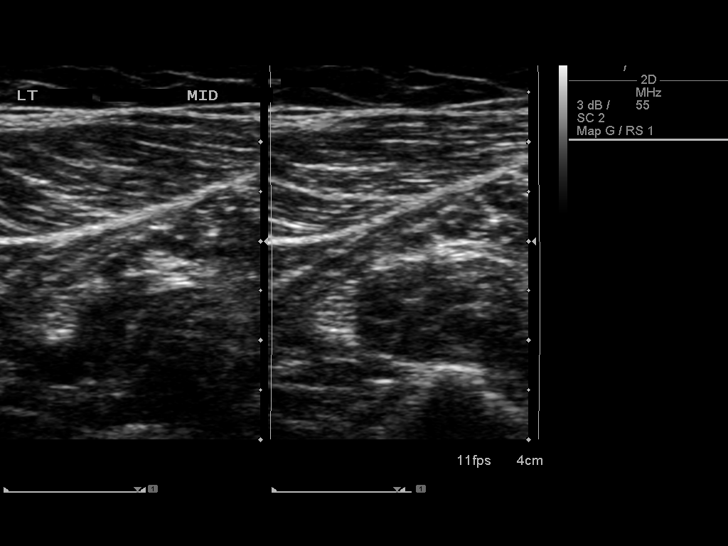
[im 14/53]
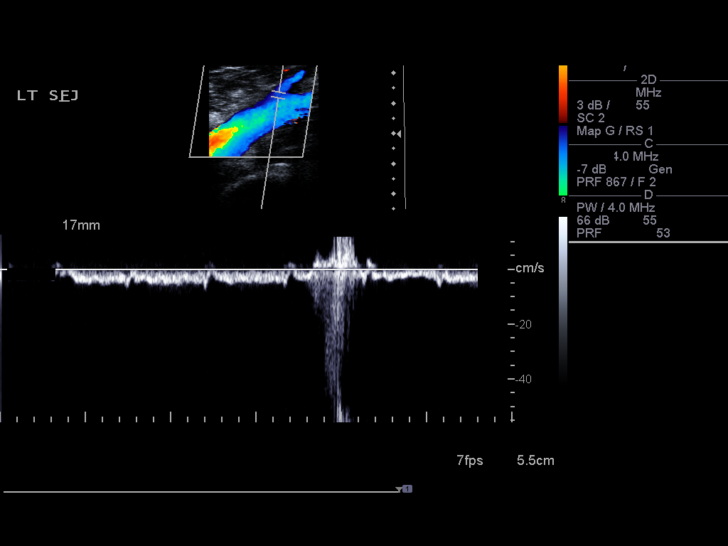
[im 19/53]
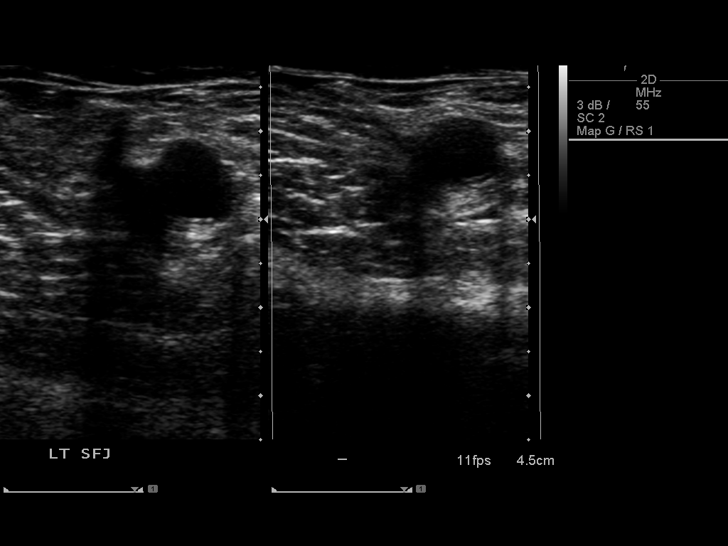
[im 23/53]
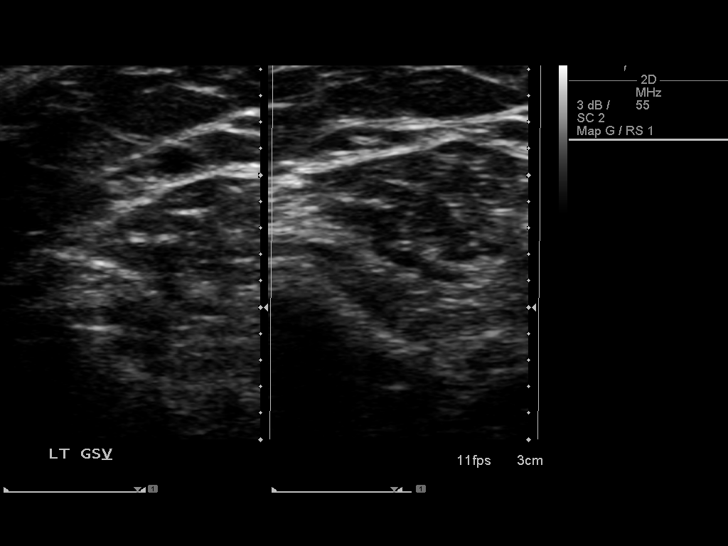
[im 28/53]
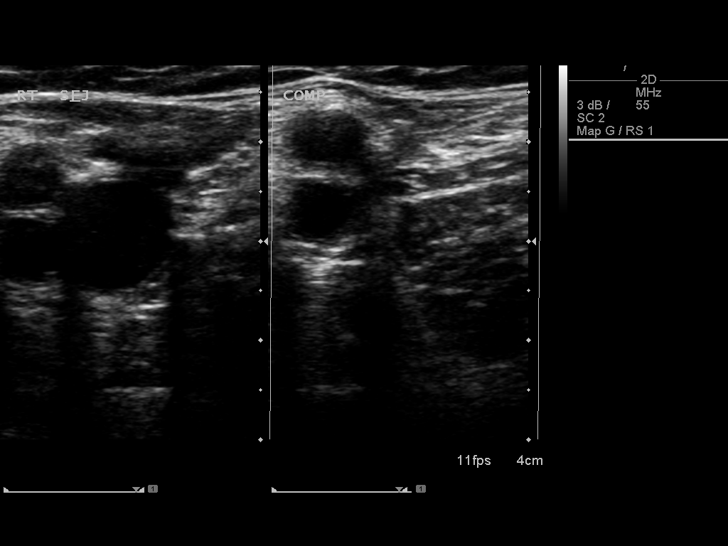
[im 30/53]
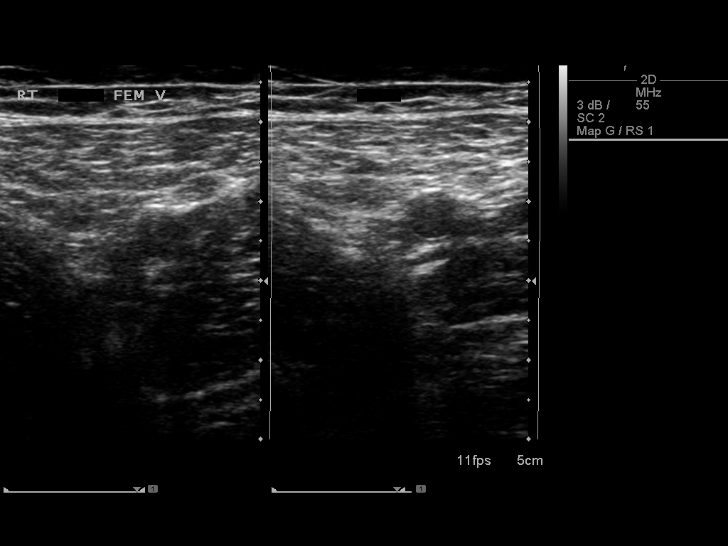
[im 34/53]
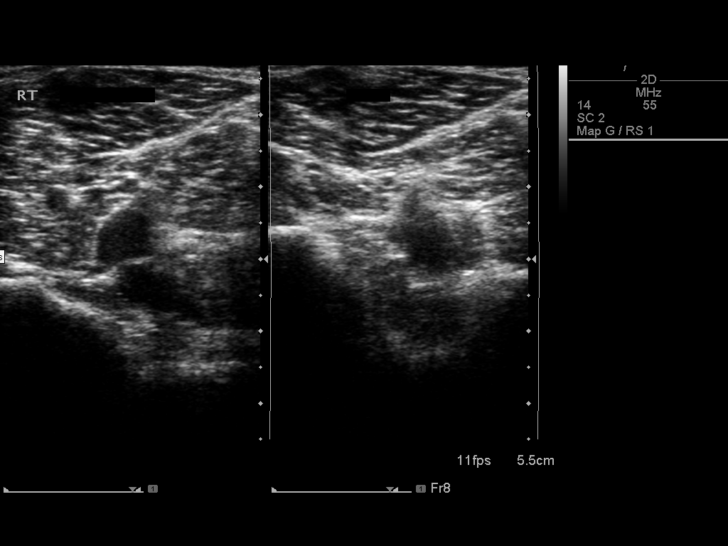
[im 39/53]
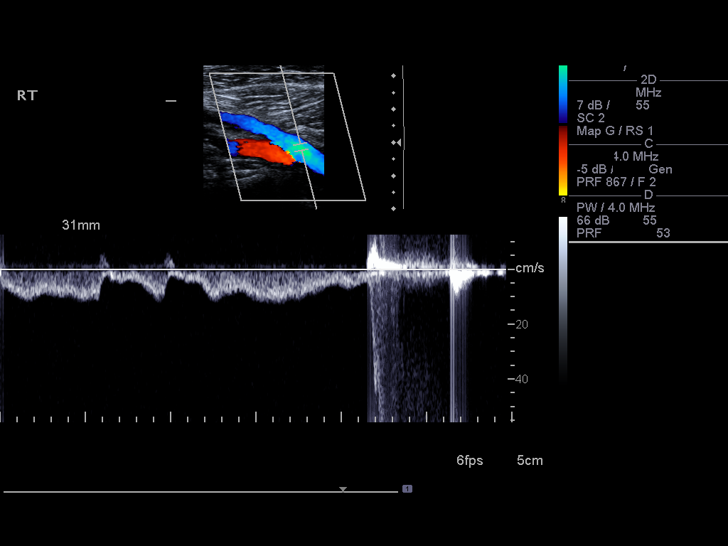
[im 43/53]
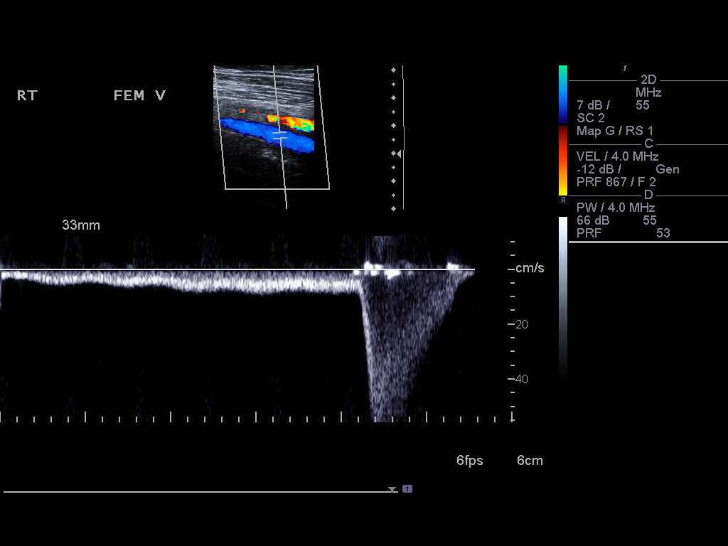
[im 48/53]
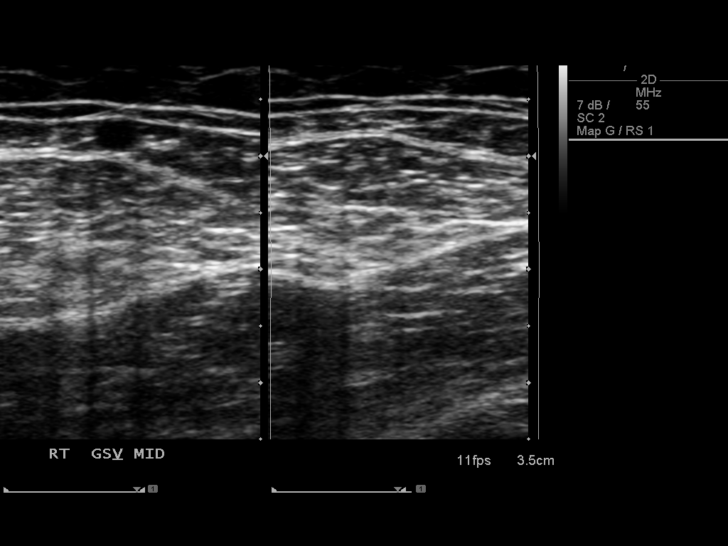
[im 53/53]
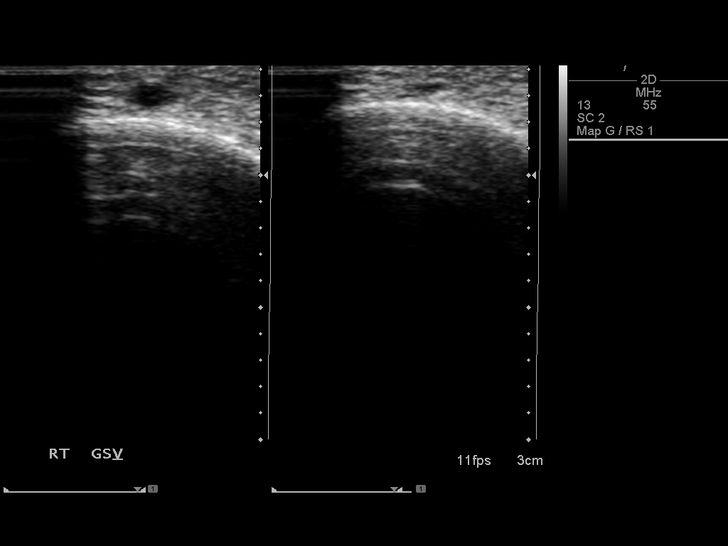

[13 of 24 positions shown; findings below may reference images not displayed]

FINDINGS: RIGHT LOWER EXTREMITY

Common Femoral Vein: No evidence of thrombus. Normal
compressibility, respiratory phasicity and response to augmentation.

Saphenofemoral Junction: No evidence of thrombus. Normal
compressibility and flow on color Doppler imaging.

Profunda Femoral Vein: No evidence of thrombus. Normal
compressibility and flow on color Doppler imaging.

Femoral Vein: No evidence of thrombus. Normal compressibility,
respiratory phasicity and response to augmentation.

Popliteal Vein: No evidence of thrombus. Normal compressibility,
respiratory phasicity and response to augmentation.

Calf Veins: No thrombus of the posterior tibial vein. Peroneal vein
not visualized.

Superficial Great Saphenous Vein: No evidence of thrombus. Normal
compressibility and flow on color Doppler imaging.

Other Findings:  None.

LEFT LOWER EXTREMITY

Common Femoral Vein: No evidence of thrombus. Normal
compressibility, respiratory phasicity and response to augmentation.

Saphenofemoral Junction: No evidence of thrombus. Normal
compressibility and flow on color Doppler imaging.

Profunda Femoral Vein: No evidence of thrombus. Normal
compressibility and flow on color Doppler imaging.

Femoral Vein: No evidence of thrombus. Normal compressibility,
respiratory phasicity and response to augmentation.

Popliteal Vein: No evidence of thrombus. Normal compressibility,
respiratory phasicity and response to augmentation.

Calf Veins: No thrombus of the posterior tibial vein. Peroneal vein
not visualized.

Superficial Great Saphenous Vein: No evidence of thrombus. Normal
compressibility and flow on color Doppler imaging.

Other Findings:  None.
IMPRESSION: Sonographic survey of the bilateral lower extremities negative for
DVT.

## 2017-03-21 DIAGNOSIS — M6740 Ganglion, unspecified site: Secondary | ICD-10-CM | POA: Diagnosis not present

## 2017-03-21 DIAGNOSIS — M67441 Ganglion, right hand: Secondary | ICD-10-CM | POA: Diagnosis not present

## 2017-03-21 DIAGNOSIS — M25531 Pain in right wrist: Secondary | ICD-10-CM | POA: Diagnosis not present

## 2017-05-27 DIAGNOSIS — G514 Facial myokymia: Secondary | ICD-10-CM | POA: Diagnosis not present

## 2017-05-27 DIAGNOSIS — H1045 Other chronic allergic conjunctivitis: Secondary | ICD-10-CM | POA: Diagnosis not present

## 2017-09-02 DIAGNOSIS — M67911 Unspecified disorder of synovium and tendon, right shoulder: Secondary | ICD-10-CM | POA: Diagnosis not present

## 2017-11-14 DIAGNOSIS — M6740 Ganglion, unspecified site: Secondary | ICD-10-CM | POA: Diagnosis not present

## 2017-11-14 DIAGNOSIS — M1811 Unilateral primary osteoarthritis of first carpometacarpal joint, right hand: Secondary | ICD-10-CM | POA: Diagnosis not present

## 2017-11-15 DIAGNOSIS — Z1231 Encounter for screening mammogram for malignant neoplasm of breast: Secondary | ICD-10-CM | POA: Diagnosis not present

## 2017-12-09 DIAGNOSIS — L509 Urticaria, unspecified: Secondary | ICD-10-CM | POA: Diagnosis not present

## 2017-12-09 DIAGNOSIS — R4586 Emotional lability: Secondary | ICD-10-CM | POA: Diagnosis not present

## 2017-12-09 DIAGNOSIS — I10 Essential (primary) hypertension: Secondary | ICD-10-CM | POA: Diagnosis not present

## 2017-12-14 DIAGNOSIS — T783XXD Angioneurotic edema, subsequent encounter: Secondary | ICD-10-CM | POA: Diagnosis not present

## 2017-12-14 DIAGNOSIS — L509 Urticaria, unspecified: Secondary | ICD-10-CM | POA: Diagnosis not present

## 2018-01-18 DIAGNOSIS — Z23 Encounter for immunization: Secondary | ICD-10-CM | POA: Diagnosis not present

## 2018-03-03 DIAGNOSIS — I10 Essential (primary) hypertension: Secondary | ICD-10-CM | POA: Diagnosis not present

## 2018-03-31 DIAGNOSIS — I10 Essential (primary) hypertension: Secondary | ICD-10-CM | POA: Diagnosis not present

## 2019-12-24 ENCOUNTER — Encounter: Payer: No Typology Code available for payment source | Admitting: Obstetrics and Gynecology

## 2021-07-28 DIAGNOSIS — H5203 Hypermetropia, bilateral: Secondary | ICD-10-CM | POA: Diagnosis not present

## 2021-07-28 DIAGNOSIS — H2513 Age-related nuclear cataract, bilateral: Secondary | ICD-10-CM | POA: Diagnosis not present

## 2021-07-28 DIAGNOSIS — H524 Presbyopia: Secondary | ICD-10-CM | POA: Diagnosis not present

## 2021-07-28 DIAGNOSIS — H43811 Vitreous degeneration, right eye: Secondary | ICD-10-CM | POA: Diagnosis not present

## 2021-10-01 DIAGNOSIS — Z5181 Encounter for therapeutic drug level monitoring: Secondary | ICD-10-CM | POA: Diagnosis not present

## 2021-10-01 DIAGNOSIS — R3129 Other microscopic hematuria: Secondary | ICD-10-CM | POA: Diagnosis not present

## 2021-10-01 DIAGNOSIS — Z0000 Encounter for general adult medical examination without abnormal findings: Secondary | ICD-10-CM | POA: Diagnosis not present

## 2021-10-01 DIAGNOSIS — I10 Essential (primary) hypertension: Secondary | ICD-10-CM | POA: Diagnosis not present

## 2021-10-01 DIAGNOSIS — D841 Defects in the complement system: Secondary | ICD-10-CM | POA: Diagnosis not present

## 2021-10-01 DIAGNOSIS — Z23 Encounter for immunization: Secondary | ICD-10-CM | POA: Diagnosis not present

## 2021-10-21 DIAGNOSIS — Z1231 Encounter for screening mammogram for malignant neoplasm of breast: Secondary | ICD-10-CM | POA: Diagnosis not present

## 2021-10-21 DIAGNOSIS — Z780 Asymptomatic menopausal state: Secondary | ICD-10-CM | POA: Diagnosis not present

## 2021-12-24 DIAGNOSIS — H5203 Hypermetropia, bilateral: Secondary | ICD-10-CM | POA: Diagnosis not present

## 2022-02-03 DIAGNOSIS — M25511 Pain in right shoulder: Secondary | ICD-10-CM | POA: Diagnosis not present

## 2022-02-03 DIAGNOSIS — M19011 Primary osteoarthritis, right shoulder: Secondary | ICD-10-CM | POA: Diagnosis not present

## 2022-02-09 DIAGNOSIS — R6889 Other general symptoms and signs: Secondary | ICD-10-CM | POA: Diagnosis not present

## 2022-02-09 DIAGNOSIS — Z03818 Encounter for observation for suspected exposure to other biological agents ruled out: Secondary | ICD-10-CM | POA: Diagnosis not present

## 2022-03-23 DIAGNOSIS — K5730 Diverticulosis of large intestine without perforation or abscess without bleeding: Secondary | ICD-10-CM | POA: Diagnosis not present

## 2022-03-23 DIAGNOSIS — Z09 Encounter for follow-up examination after completed treatment for conditions other than malignant neoplasm: Secondary | ICD-10-CM | POA: Diagnosis not present

## 2022-03-23 DIAGNOSIS — K648 Other hemorrhoids: Secondary | ICD-10-CM | POA: Diagnosis not present

## 2022-03-23 DIAGNOSIS — Z86010 Personal history of colonic polyps: Secondary | ICD-10-CM | POA: Diagnosis not present

## 2022-03-29 DIAGNOSIS — K080 Exfoliation of teeth due to systemic causes: Secondary | ICD-10-CM | POA: Diagnosis not present

## 2022-04-27 DIAGNOSIS — D841 Defects in the complement system: Secondary | ICD-10-CM | POA: Diagnosis not present

## 2022-07-27 DIAGNOSIS — N898 Other specified noninflammatory disorders of vagina: Secondary | ICD-10-CM | POA: Diagnosis not present

## 2022-09-06 DIAGNOSIS — H52223 Regular astigmatism, bilateral: Secondary | ICD-10-CM | POA: Diagnosis not present

## 2022-09-09 DIAGNOSIS — K080 Exfoliation of teeth due to systemic causes: Secondary | ICD-10-CM | POA: Diagnosis not present

## 2022-10-05 DIAGNOSIS — K080 Exfoliation of teeth due to systemic causes: Secondary | ICD-10-CM | POA: Diagnosis not present

## 2022-10-06 DIAGNOSIS — I10 Essential (primary) hypertension: Secondary | ICD-10-CM | POA: Diagnosis not present

## 2022-10-06 DIAGNOSIS — Z0000 Encounter for general adult medical examination without abnormal findings: Secondary | ICD-10-CM | POA: Diagnosis not present

## 2022-10-06 DIAGNOSIS — N1831 Chronic kidney disease, stage 3a: Secondary | ICD-10-CM | POA: Diagnosis not present

## 2022-10-26 DIAGNOSIS — K080 Exfoliation of teeth due to systemic causes: Secondary | ICD-10-CM | POA: Diagnosis not present

## 2022-11-03 DIAGNOSIS — Z1231 Encounter for screening mammogram for malignant neoplasm of breast: Secondary | ICD-10-CM | POA: Diagnosis not present

## 2022-11-05 DIAGNOSIS — H524 Presbyopia: Secondary | ICD-10-CM | POA: Diagnosis not present

## 2023-02-08 DIAGNOSIS — R002 Palpitations: Secondary | ICD-10-CM | POA: Diagnosis not present

## 2023-02-21 DIAGNOSIS — R002 Palpitations: Secondary | ICD-10-CM | POA: Diagnosis not present

## 2023-02-28 DIAGNOSIS — R002 Palpitations: Secondary | ICD-10-CM | POA: Diagnosis not present

## 2023-03-07 DIAGNOSIS — K080 Exfoliation of teeth due to systemic causes: Secondary | ICD-10-CM | POA: Diagnosis not present

## 2023-03-16 DIAGNOSIS — K080 Exfoliation of teeth due to systemic causes: Secondary | ICD-10-CM | POA: Diagnosis not present

## 2023-03-31 DIAGNOSIS — K080 Exfoliation of teeth due to systemic causes: Secondary | ICD-10-CM | POA: Diagnosis not present

## 2023-04-05 DIAGNOSIS — D841 Defects in the complement system: Secondary | ICD-10-CM | POA: Diagnosis not present

## 2023-05-02 DIAGNOSIS — K080 Exfoliation of teeth due to systemic causes: Secondary | ICD-10-CM | POA: Diagnosis not present

## 2023-05-23 DIAGNOSIS — K080 Exfoliation of teeth due to systemic causes: Secondary | ICD-10-CM | POA: Diagnosis not present

## 2023-07-21 DIAGNOSIS — I10 Essential (primary) hypertension: Secondary | ICD-10-CM | POA: Diagnosis not present

## 2023-07-21 DIAGNOSIS — N1831 Chronic kidney disease, stage 3a: Secondary | ICD-10-CM | POA: Diagnosis not present

## 2023-07-23 DIAGNOSIS — N1831 Chronic kidney disease, stage 3a: Secondary | ICD-10-CM | POA: Diagnosis not present

## 2023-07-23 DIAGNOSIS — I10 Essential (primary) hypertension: Secondary | ICD-10-CM | POA: Diagnosis not present

## 2023-07-28 DIAGNOSIS — R42 Dizziness and giddiness: Secondary | ICD-10-CM | POA: Diagnosis not present

## 2023-08-19 DIAGNOSIS — I10 Essential (primary) hypertension: Secondary | ICD-10-CM | POA: Diagnosis not present

## 2023-08-19 DIAGNOSIS — N1831 Chronic kidney disease, stage 3a: Secondary | ICD-10-CM | POA: Diagnosis not present

## 2023-08-22 DIAGNOSIS — N1831 Chronic kidney disease, stage 3a: Secondary | ICD-10-CM | POA: Diagnosis not present

## 2023-08-22 DIAGNOSIS — I10 Essential (primary) hypertension: Secondary | ICD-10-CM | POA: Diagnosis not present

## 2023-09-18 DIAGNOSIS — I10 Essential (primary) hypertension: Secondary | ICD-10-CM | POA: Diagnosis not present

## 2023-09-18 DIAGNOSIS — N1831 Chronic kidney disease, stage 3a: Secondary | ICD-10-CM | POA: Diagnosis not present

## 2023-09-22 DIAGNOSIS — I10 Essential (primary) hypertension: Secondary | ICD-10-CM | POA: Diagnosis not present

## 2023-09-22 DIAGNOSIS — N1831 Chronic kidney disease, stage 3a: Secondary | ICD-10-CM | POA: Diagnosis not present

## 2023-10-18 DIAGNOSIS — I10 Essential (primary) hypertension: Secondary | ICD-10-CM | POA: Diagnosis not present

## 2023-10-18 DIAGNOSIS — N1831 Chronic kidney disease, stage 3a: Secondary | ICD-10-CM | POA: Diagnosis not present

## 2023-10-23 DIAGNOSIS — I10 Essential (primary) hypertension: Secondary | ICD-10-CM | POA: Diagnosis not present

## 2023-10-23 DIAGNOSIS — N1831 Chronic kidney disease, stage 3a: Secondary | ICD-10-CM | POA: Diagnosis not present

## 2023-11-02 DIAGNOSIS — M19011 Primary osteoarthritis, right shoulder: Secondary | ICD-10-CM | POA: Diagnosis not present

## 2023-11-02 DIAGNOSIS — M19012 Primary osteoarthritis, left shoulder: Secondary | ICD-10-CM | POA: Diagnosis not present

## 2023-11-09 DIAGNOSIS — Z1231 Encounter for screening mammogram for malignant neoplasm of breast: Secondary | ICD-10-CM | POA: Diagnosis not present

## 2023-11-16 DIAGNOSIS — I10 Essential (primary) hypertension: Secondary | ICD-10-CM | POA: Diagnosis not present

## 2023-11-16 DIAGNOSIS — R42 Dizziness and giddiness: Secondary | ICD-10-CM | POA: Diagnosis not present

## 2023-11-17 DIAGNOSIS — N1831 Chronic kidney disease, stage 3a: Secondary | ICD-10-CM | POA: Diagnosis not present

## 2023-11-17 DIAGNOSIS — I10 Essential (primary) hypertension: Secondary | ICD-10-CM | POA: Diagnosis not present

## 2023-11-22 DIAGNOSIS — I10 Essential (primary) hypertension: Secondary | ICD-10-CM | POA: Diagnosis not present

## 2023-11-22 DIAGNOSIS — N1831 Chronic kidney disease, stage 3a: Secondary | ICD-10-CM | POA: Diagnosis not present

## 2023-12-08 DIAGNOSIS — Z5181 Encounter for therapeutic drug level monitoring: Secondary | ICD-10-CM | POA: Diagnosis not present

## 2023-12-08 DIAGNOSIS — D841 Defects in the complement system: Secondary | ICD-10-CM | POA: Diagnosis not present

## 2023-12-08 DIAGNOSIS — Z23 Encounter for immunization: Secondary | ICD-10-CM | POA: Diagnosis not present

## 2023-12-08 DIAGNOSIS — I10 Essential (primary) hypertension: Secondary | ICD-10-CM | POA: Diagnosis not present

## 2023-12-08 DIAGNOSIS — Z0000 Encounter for general adult medical examination without abnormal findings: Secondary | ICD-10-CM | POA: Diagnosis not present

## 2023-12-17 DIAGNOSIS — I10 Essential (primary) hypertension: Secondary | ICD-10-CM | POA: Diagnosis not present

## 2023-12-17 DIAGNOSIS — N1831 Chronic kidney disease, stage 3a: Secondary | ICD-10-CM | POA: Diagnosis not present

## 2023-12-23 DIAGNOSIS — I10 Essential (primary) hypertension: Secondary | ICD-10-CM | POA: Diagnosis not present

## 2023-12-23 DIAGNOSIS — N1831 Chronic kidney disease, stage 3a: Secondary | ICD-10-CM | POA: Diagnosis not present

## 2024-01-06 ENCOUNTER — Other Ambulatory Visit: Payer: Self-pay | Admitting: Orthopedic Surgery

## 2024-01-06 DIAGNOSIS — M79642 Pain in left hand: Secondary | ICD-10-CM | POA: Diagnosis not present

## 2024-01-16 DIAGNOSIS — N1831 Chronic kidney disease, stage 3a: Secondary | ICD-10-CM | POA: Diagnosis not present

## 2024-01-16 DIAGNOSIS — I10 Essential (primary) hypertension: Secondary | ICD-10-CM | POA: Diagnosis not present

## 2024-01-22 DIAGNOSIS — I10 Essential (primary) hypertension: Secondary | ICD-10-CM | POA: Diagnosis not present

## 2024-01-22 DIAGNOSIS — N1831 Chronic kidney disease, stage 3a: Secondary | ICD-10-CM | POA: Diagnosis not present

## 2024-01-27 ENCOUNTER — Encounter (HOSPITAL_BASED_OUTPATIENT_CLINIC_OR_DEPARTMENT_OTHER): Payer: Self-pay | Admitting: Orthopedic Surgery

## 2024-01-30 ENCOUNTER — Encounter (HOSPITAL_BASED_OUTPATIENT_CLINIC_OR_DEPARTMENT_OTHER): Payer: Self-pay | Admitting: Orthopedic Surgery

## 2024-01-30 ENCOUNTER — Other Ambulatory Visit: Payer: Self-pay

## 2024-02-01 ENCOUNTER — Encounter (HOSPITAL_BASED_OUTPATIENT_CLINIC_OR_DEPARTMENT_OTHER)
Admission: RE | Admit: 2024-02-01 | Discharge: 2024-02-01 | Disposition: A | Discharge status: Home / Self Care | Source: Ambulatory Visit | Attending: Orthopedic Surgery | Admitting: Orthopedic Surgery

## 2024-02-01 DIAGNOSIS — I10 Essential (primary) hypertension: Secondary | ICD-10-CM | POA: Diagnosis not present

## 2024-02-01 DIAGNOSIS — M19042 Primary osteoarthritis, left hand: Secondary | ICD-10-CM | POA: Diagnosis not present

## 2024-02-01 DIAGNOSIS — Z79899 Other long term (current) drug therapy: Secondary | ICD-10-CM | POA: Diagnosis not present

## 2024-02-01 DIAGNOSIS — I440 Atrioventricular block, first degree: Secondary | ICD-10-CM | POA: Diagnosis not present

## 2024-02-01 DIAGNOSIS — Z01818 Encounter for other preprocedural examination: Secondary | ICD-10-CM | POA: Diagnosis not present

## 2024-02-01 LAB — BASIC METABOLIC PANEL WITH GFR
Anion gap: 13 (ref 5–15)
BUN: 15 mg/dL (ref 8–23)
CO2: 25 mmol/L (ref 22–32)
Calcium: 9.3 mg/dL (ref 8.9–10.3)
Chloride: 104 mmol/L (ref 98–111)
Creatinine, Ser: 1.09 mg/dL — ABNORMAL HIGH (ref 0.44–1.00)
GFR, Estimated: 56 mL/min — ABNORMAL LOW (ref >60)
Glucose, Bld: 149 mg/dL — ABNORMAL HIGH (ref 70–99)
Potassium: 3.9 mmol/L (ref 3.5–5.1)
Sodium: 142 mmol/L (ref 135–145)

## 2024-02-01 NOTE — Progress Notes (Signed)

## 2024-02-02 ENCOUNTER — Encounter (HOSPITAL_BASED_OUTPATIENT_CLINIC_OR_DEPARTMENT_OTHER): Admission: RE | Disposition: A | Payer: Self-pay | Source: Home / Self Care | Attending: Orthopedic Surgery

## 2024-02-02 ENCOUNTER — Other Ambulatory Visit: Payer: Self-pay

## 2024-02-02 ENCOUNTER — Ambulatory Visit (HOSPITAL_BASED_OUTPATIENT_CLINIC_OR_DEPARTMENT_OTHER): Admitting: Anesthesiology

## 2024-02-02 ENCOUNTER — Encounter (HOSPITAL_BASED_OUTPATIENT_CLINIC_OR_DEPARTMENT_OTHER): Payer: Self-pay | Admitting: Orthopedic Surgery

## 2024-02-02 ENCOUNTER — Ambulatory Visit (HOSPITAL_BASED_OUTPATIENT_CLINIC_OR_DEPARTMENT_OTHER)

## 2024-02-02 ENCOUNTER — Ambulatory Visit (HOSPITAL_BASED_OUTPATIENT_CLINIC_OR_DEPARTMENT_OTHER)
Admission: RE | Admit: 2024-02-02 | Discharge: 2024-02-02 | Disposition: A | Attending: Orthopedic Surgery | Admitting: Orthopedic Surgery

## 2024-02-02 DIAGNOSIS — I10 Essential (primary) hypertension: Secondary | ICD-10-CM

## 2024-02-02 DIAGNOSIS — I440 Atrioventricular block, first degree: Secondary | ICD-10-CM | POA: Diagnosis not present

## 2024-02-02 DIAGNOSIS — Z79899 Other long term (current) drug therapy: Secondary | ICD-10-CM | POA: Diagnosis not present

## 2024-02-02 DIAGNOSIS — M19042 Primary osteoarthritis, left hand: Secondary | ICD-10-CM | POA: Diagnosis not present

## 2024-02-02 DIAGNOSIS — M67431 Ganglion, right wrist: Secondary | ICD-10-CM

## 2024-02-02 HISTORY — DX: Defects in the complement system: D84.1

## 2024-02-02 HISTORY — PX: DISTAL INTERPHALANGEAL JOINT FUSION: SHX6428

## 2024-02-02 SURGERY — DISTAL INTERPHALANGEAL JOINT FUSION
Anesthesia: General | Site: Ring Finger | Laterality: Left

## 2024-02-02 MED ORDER — PHENYLEPHRINE 80 MCG/ML (10ML) SYRINGE FOR IV PUSH (FOR BLOOD PRESSURE SUPPORT)
PREFILLED_SYRINGE | INTRAVENOUS | Status: AC
  Filled 2024-02-02: qty 10

## 2024-02-02 MED ORDER — EPHEDRINE 5 MG/ML INJ
INTRAVENOUS | Status: AC
  Filled 2024-02-02: qty 5

## 2024-02-02 MED ORDER — SUCCINYLCHOLINE CHLORIDE 200 MG/10ML IV SOSY
PREFILLED_SYRINGE | INTRAVENOUS | Status: AC
  Filled 2024-02-02: qty 10

## 2024-02-02 MED ORDER — MIDAZOLAM HCL 5 MG/5ML IJ SOLN
INTRAMUSCULAR | Status: DC | PRN
  Administered 2024-02-02 (×2): 1 mg via INTRAVENOUS

## 2024-02-02 MED ORDER — DEXAMETHASONE SODIUM PHOSPHATE 4 MG/ML IJ SOLN
INTRAMUSCULAR | Status: DC | PRN
  Administered 2024-02-02: 5 mg via INTRAVENOUS

## 2024-02-02 MED ORDER — FENTANYL CITRATE (PF) 100 MCG/2ML IJ SOLN
INTRAMUSCULAR | Status: AC
  Filled 2024-02-02: qty 2

## 2024-02-02 MED ORDER — HYDROCODONE-ACETAMINOPHEN 5-325 MG PO TABS: 1.0000 | ORAL_TABLET | Freq: Four times a day (QID) | ORAL | 0 refills | Status: AC | PRN

## 2024-02-02 MED ORDER — PROPOFOL 10 MG/ML IV BOLUS
INTRAVENOUS | Status: DC | PRN
  Administered 2024-02-02: 150 mg via INTRAVENOUS

## 2024-02-02 MED ORDER — DIPHENHYDRAMINE HCL 50 MG/ML IJ SOLN
INTRAMUSCULAR | Status: AC
  Filled 2024-02-02: qty 1

## 2024-02-02 MED ORDER — BUPIVACAINE HCL (PF) 0.25 % IJ SOLN
INTRAMUSCULAR | Status: DC | PRN
  Administered 2024-02-02: 9 mL

## 2024-02-02 MED ORDER — PROPOFOL 500 MG/50ML IV EMUL
INTRAVENOUS | Status: DC | PRN
  Administered 2024-02-02: 200 ug/kg/min via INTRAVENOUS

## 2024-02-02 MED ORDER — ATROPINE SULFATE (PF) 0.4 MG/ML IJ SOLN
INTRAMUSCULAR | Status: AC
  Filled 2024-02-02: qty 1

## 2024-02-02 MED ORDER — ONDANSETRON HCL 4 MG/2ML IJ SOLN
INTRAMUSCULAR | Status: DC | PRN
  Administered 2024-02-02: 4 mg via INTRAVENOUS

## 2024-02-02 MED ORDER — LIDOCAINE HCL (CARDIAC) PF 100 MG/5ML IV SOSY
PREFILLED_SYRINGE | INTRAVENOUS | Status: DC | PRN
  Administered 2024-02-02: 20 mg via INTRAVENOUS

## 2024-02-02 MED ORDER — LIDOCAINE 2% (20 MG/ML) 5 ML SYRINGE
INTRAMUSCULAR | Status: AC
  Filled 2024-02-02: qty 5

## 2024-02-02 MED ORDER — LACTATED RINGERS IV SOLN: INTRAVENOUS | Status: DC

## 2024-02-02 MED ORDER — FENTANYL CITRATE (PF) 100 MCG/2ML IJ SOLN
INTRAMUSCULAR | Status: DC | PRN
  Administered 2024-02-02 (×2): 50 ug via INTRAVENOUS

## 2024-02-02 MED ORDER — ONDANSETRON HCL 4 MG/2ML IJ SOLN
INTRAMUSCULAR | Status: AC
  Filled 2024-02-02: qty 2

## 2024-02-02 MED ORDER — MIDAZOLAM HCL 2 MG/2ML IJ SOLN
INTRAMUSCULAR | Status: AC
  Filled 2024-02-02: qty 2

## 2024-02-02 MED ORDER — FENTANYL CITRATE (PF) 100 MCG/2ML IJ SOLN: 25.0000 ug | INTRAMUSCULAR | Status: DC | PRN

## 2024-02-02 MED ORDER — ACETAMINOPHEN 500 MG PO TABS
1000.0000 mg | ORAL_TABLET | Freq: Once | ORAL | Status: AC
  Administered 2024-02-02: 1000 mg via ORAL

## 2024-02-02 MED ORDER — DIPHENHYDRAMINE HCL 50 MG/ML IJ SOLN
INTRAMUSCULAR | Status: DC | PRN
  Administered 2024-02-02: 6.25 mg via INTRAVENOUS

## 2024-02-02 MED ORDER — CEFAZOLIN SODIUM-DEXTROSE 2-4 GM/100ML-% IV SOLN
2.0000 g | INTRAVENOUS | Status: AC
  Administered 2024-02-02: 2 g via INTRAVENOUS

## 2024-02-02 MED ORDER — ACETAMINOPHEN 500 MG PO TABS
ORAL_TABLET | ORAL | Status: AC
  Filled 2024-02-02: qty 2

## 2024-02-02 SURGICAL SUPPLY — 41 items
BLADE MINI RND TIP GREEN BEAV (BLADE) ×1 IMPLANT
BLADE SURG 15 STRL LF DISP TIS (BLADE) ×2 IMPLANT
BNDG COHESIVE 1X5 TAN STRL LF (GAUZE/BANDAGES/DRESSINGS) IMPLANT
BNDG COMPR ESMARK 4X3 LF (GAUZE/BANDAGES/DRESSINGS) ×1 IMPLANT
BNDG ELASTIC 2INX 5YD STR LF (GAUZE/BANDAGES/DRESSINGS) IMPLANT
BNDG ELASTIC 3INX 5YD STR LF (GAUZE/BANDAGES/DRESSINGS) ×1 IMPLANT
BNDG GAUZE DERMACEA FLUFF 4 (GAUZE/BANDAGES/DRESSINGS) ×1 IMPLANT
CHLORAPREP W/TINT 26 (MISCELLANEOUS) ×1 IMPLANT
CORD BIPOLAR FORCEPS 12FT (ELECTRODE) ×1 IMPLANT
COVER BACK TABLE 60X90IN (DRAPES) ×1 IMPLANT
COVER MAYO STAND STRL (DRAPES) ×1 IMPLANT
CUFF TOURN SGL QUICK 18X4 (TOURNIQUET CUFF) ×1 IMPLANT
DRAPE EXTREMITY T 121X128X90 (DISPOSABLE) ×1 IMPLANT
DRAPE OEC MINIVIEW 54X84 (DRAPES) ×1 IMPLANT
DRAPE SURG 17X23 STRL (DRAPES) ×1 IMPLANT
DRILL NANO ACTRK 3 (DRILL) IMPLANT
GAUZE SPONGE 4X4 12PLY STRL (GAUZE/BANDAGES/DRESSINGS) ×1 IMPLANT
GAUZE XEROFORM 1X8 LF (GAUZE/BANDAGES/DRESSINGS) ×1 IMPLANT
GLOVE BIO SURGEON STRL SZ7.5 (GLOVE) ×1 IMPLANT
GLOVE BIOGEL PI IND STRL 8 (GLOVE) ×1 IMPLANT
GLOVE BIOGEL PI IND STRL 8.5 (GLOVE) ×1 IMPLANT
GLOVE SURG ORTHO 8.0 STRL STRW (GLOVE) IMPLANT
GOWN STRL REUS W/ TWL LRG LVL3 (GOWN DISPOSABLE) ×1 IMPLANT
GOWN STRL REUS W/TWL XL LVL3 (GOWN DISPOSABLE) ×1 IMPLANT
GUIDEWIRE DBL END .7 (WIRE) IMPLANT
PACK BASIN DAY SURGERY FS (CUSTOM PROCEDURE TRAY) ×1 IMPLANT
PAD CAST 3X4 CTTN HI CHSV (CAST SUPPLIES) ×1 IMPLANT
PADDING CAST ABS COTTON 3X4 (CAST SUPPLIES) IMPLANT
PADDING CAST ABS COTTON 4X4 ST (CAST SUPPLIES) ×1 IMPLANT
SCREW BONE NANO ACTRK 3 24 (Screw) IMPLANT
SLEEVE SCD COMPRESS KNEE MED (STOCKING) IMPLANT
SLING ARM FOAM STRAP LRG (SOFTGOODS) IMPLANT
SOLN 0.9% NACL POUR BTL 1000ML (IV SOLUTION) ×1 IMPLANT
SPLINT PLASTER CAST XFAST 3X15 (CAST SUPPLIES) IMPLANT
STOCKINETTE 4X48 STRL (DRAPES) ×1 IMPLANT
SUT ETHILON 3 0 PS 1 (SUTURE) IMPLANT
SUT ETHILON 4 0 PS 2 18 (SUTURE) IMPLANT
SYR BULB EAR ULCER 3OZ GRN STR (SYRINGE) ×1 IMPLANT
SYR CONTROL 10ML LL (SYRINGE) IMPLANT
TOWEL GREEN STERILE FF (TOWEL DISPOSABLE) ×1 IMPLANT
UNDERPAD 30X36 HEAVY ABSORB (UNDERPADS AND DIAPERS) ×1 IMPLANT

## 2024-02-02 NOTE — Anesthesia Preprocedure Evaluation (Addendum)
 Anesthesia Evaluation  Patient identified by MRN, date of birth, ID band Patient awake    Reviewed: Allergy & Precautions, H&P , NPO status , Patient's Chart, lab work & pertinent test results  Airway Mallampati: III  TM Distance: >3 FB Neck ROM: Full    Dental no notable dental hx. (+) Teeth Intact, Dental Advisory Given   Pulmonary neg pulmonary ROS   Pulmonary exam normal breath sounds clear to auscultation       Cardiovascular hypertension, Pt. on medications  Rhythm:Regular Rate:Normal     Neuro/Psych negative neurological ROS  negative psych ROS   GI/Hepatic negative GI ROS, Neg liver ROS,,,  Endo/Other  negative endocrine ROS    Renal/GU negative Renal ROS  negative genitourinary   Musculoskeletal  (+) Arthritis , Osteoarthritis,    Abdominal   Peds  Hematology negative hematology ROS (+)   Anesthesia Other Findings   Reproductive/Obstetrics negative OB ROS                              Anesthesia Physical Anesthesia Plan  ASA: 2  Anesthesia Plan: General   Post-op Pain Management: Tylenol  PO (pre-op)*   Induction: Intravenous  PONV Risk Score and Plan: 4 or greater and Ondansetron  and Dexamethasone  Airway Management Planned: LMA  Additional Equipment:   Intra-op Plan:   Post-operative Plan: Extubation in OR  Informed Consent: I have reviewed the patients History and Physical, chart, labs and discussed the procedure including the risks, benefits and alternatives for the proposed anesthesia with the patient or authorized representative who has indicated his/her understanding and acceptance.     Dental advisory given  Plan Discussed with: CRNA  Anesthesia Plan Comments:          Anesthesia Quick Evaluation

## 2024-02-02 NOTE — Op Note (Signed)
 NAME: Sheri Shelton MEDICAL RECORD NO: [id number] DATE OF BIRTH: 10-25-1955 FACILITY: [facility] LOCATION: [facility] PHYSICIAN: Chanae Gemma R. 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   ASSISTANT: Arley Curia, MD   ANESTHESIA:  General   INTRAVENOUS FLUIDS:  Per anesthesia flow sheet.   ESTIMATED BLOOD LOSS:  Minimal.   COMPLICATIONS:  None.   SPECIMENS:  none   TOURNIQUET TIME:    Total Tourniquet Time Documented: Upper Arm (Left) - 47 minutes Total: Upper Arm (Left) - 47 minutes    DISPOSITION:  Stable to PACU.   INDICATIONS: 68 year old female with left ring finger DIP joint arthritis.  This is painful to her.  She has had other DIP joints fused and has done well.  She wishes to proceed with left ring finger DIP joint fusion.  Risks, benefits and alternatives of surgery were discussed including the risks of blood loss, infection, damage to nerves, vessels, tendons, ligaments, bone for surgery, need for additional surgery, complications with wound healing, continued pain, stiffness, , nonunion, malunion.  She voiced understanding of these risks and elected to proceed.  OPERATIVE COURSE:  After being identified preoperatively by myself,  the patient and I agreed on the procedure and site of the procedure.  The surgical site was marked.  Surgical consent had been signed. Preoperative IV antibiotic prophylaxis was given. She was transferred to the operating room and placed on the operating table in supine position with the left upper extremity on an arm board.  General anesthesia was induced by the anesthesiologist.  Left upper extremity was prepped and draped in normal sterile orthopedic fashion.  A surgical pause was performed between the surgeons,  anesthesia, and operating room staff and all were in agreement as to the patient, procedure, and site of procedure.  Tourniquet at the proximal aspect of the extremity was inflated to 250 mmHg after exsanguination of the arm with an Esmarch bandage.  Incision was made over the DIP joint of the ring finger.  This was carried in subcutaneous tissues by spreading technique.  Bipolar electrocautery was used to obtain hemostasis.  The extensor tendon was sharply incised and the joint visualized.  The knife was used to release the radial and ulnar collateral ligaments from the middle phalanx.  The joint was opened.  The rongeurs were used to remove the subchondral bone of the middle phalanx.  The guidepin was then used to drill holes in the subchondral bone of the distal phalanx and the rondure was used to remove the subchondral bone as well.  Both bone ends had good cancellous bone exposed.  C-arm was used in AP and lateral projections to ensure appropriate removal of bone and position of the joint for the fusion.  There was a piece of bone remaining on the radial side which was removed with rongeurs.  The guidepin was advanced in an antegrade fashion across the DIP joint and out the tip of the finger.  It was then advanced retrograde into the middle phalanx.  The C-arm was used in AP and lateral projections to ensure appropriate position of the fusion site and position of the guidepin which was the case.  An incision was made at the tip of the finger around the guidepin and the  drill used to open the distal phalanx.  The measuring device had been used and it was determined with a 24 mm screw would be appropriate length.  The screw was then advanced over the guidepin.  Good compression was obtained at the fusion site.  The guide pin was removed.  C-arm was used in AP and lateral projections to ensure appropriate position of the screw and position of the fusion which was the case.  The wound was copiously irrigated with  sterile saline.  The dorsal wound was closed with 4-0 nylon in a horizontal mattress fashion.  The wound at the tip of the finger was closed with a 4-0 chromic suture.  Digital block was performed with quarter percent plain Marcaine to aid in postoperative analgesia.  The wounds were dressed with sterile Xeroform 4 x 4 and wrapped with a Coban dressing lightly.  AlumaFoam splint was placed and wrapped lightly with Coban dressing.  The tourniquet was deflated at 47 minutes.  Fingertips were pink with brisk capillary refill after deflation of tourniquet.  The operative  drapes were broken down.  The patient was awoken from anesthesia safely.  She was transferred back to the stretcher and taken to PACU in stable condition.  I will see her back in the office in 1 week for postoperative followup.  I will give her a prescription for Norco 5/325 1 tab PO q6 hours prn pain, dispense #20.   Saba Neuman, MD Electronically signed, 02/02/2024

## 2024-02-02 NOTE — Discharge Instructions (Addendum)
°  Post Anesthesia Home Care Instructions  Activity: Get plenty of rest for the remainder of the day. A responsible individual must stay with you for 24 hours following the procedure.  For the next 24 hours, DO NOT: -Drive a car -Advertising copywriter -Drink alcoholic beverages -Take any medication unless instructed by your physician -Make any legal decisions or sign important papers.  Meals: Start with liquid foods such as gelatin or soup. Progress to regular foods as tolerated. Avoid greasy, spicy, heavy foods. If nausea and/or vomiting occur, drink only clear liquids until the nausea and/or vomiting subsides. Call your physician if vomiting continues.  Special Instructions/Symptoms: Your throat may feel dry or sore from the anesthesia or the breathing tube placed in your throat during surgery. If this causes discomfort, gargle with warm salt water. The discomfort should disappear within 24 hours.  If you had a scopolamine patch placed behind your ear for the management of post- operative nausea and/or vomiting:  1. The medication in the patch is effective for 72 hours, after which it should be removed.  Wrap patch in a tissue and discard in the trash. Wash hands thoroughly with soap and water. 2. You may remove the patch earlier than 72 hours if you experience unpleasant side effects which may include dry mouth, dizziness or visual disturbances. 3. Avoid touching the patch. Wash your hands with soap and water after contact with the patch.      Next dose of Tylenol  may be given at 3:30pm if needed.  [facility] Instructions Hand Surgery  Wound Care: Keep your hand elevated above the level of your heart.  Do not allow it to dangle by your side.  Keep the dressing dry and do not remove it unless your doctor advises you to do so.  He will usually change it at the time of your post-op visit.  Moving your fingers is advised to stimulate circulation but will depend on the site of your surgery.   If you have a splint applied, your doctor will advise you regarding movement.  Activity: Do not drive or operate machinery today.  Rest today and then you may return to your normal activity and work as indicated by your physician.  Diet:  Drink liquids today or eat a light diet.  You may resume a regular diet tomorrow.    General expectations: Pain for two to three days. Fingers may become slightly swollen.  Call your doctor if any of the following occur: Severe pain not relieved by pain medication. Elevated temperature. Dressing soaked with blood. Inability to move fingers. White or bluish color to fingers.

## 2024-02-02 NOTE — Anesthesia Procedure Notes (Signed)
 Procedure Name: LMA Insertion Date/Time: 02/02/2024 11:14 AM  Performed by: Emilio Rock BIRCH, CRNAPre-anesthesia Checklist: Patient identified, Emergency Drugs available, Suction available and Patient being monitored Patient Re-evaluated:Patient Re-evaluated prior to induction Oxygen Delivery Method: Circle System Utilized Preoxygenation: Pre-oxygenation with 100% oxygen Induction Type: IV induction Ventilation: Mask ventilation without difficulty LMA: LMA inserted LMA Size: 4.0 Number of attempts: 1 Airway Equipment and Method: bite block Placement Confirmation: positive ETCO2 Tube secured with: Tape Dental Injury: Teeth and Oropharynx as per pre-operative assessment

## 2024-02-02 NOTE — Anesthesia Postprocedure Evaluation (Signed)
 Anesthesia Post Note  Patient: Sheri Shelton  Procedure(s) Performed: DISTAL INTERPHALANGEAL JOINT FUSION (Left: Ring Finger)     Patient location during evaluation: PACU Anesthesia Type: General Level of consciousness: awake and alert Pain management: pain level controlled Vital Signs Assessment: post-procedure vital signs reviewed and stable Respiratory status: spontaneous breathing, nonlabored ventilation and respiratory function stable Cardiovascular status: blood pressure returned to baseline and stable Postop Assessment: no apparent nausea or vomiting Anesthetic complications: no   No notable events documented.  Last Vitals:  Vitals:   02/02/24 1258 02/02/24 1307  BP: 113/80 117/78  Pulse: 69 74  Resp: 13 16  Temp:  (!) 36.4 C  SpO2: 93% 95%    Last Pain:  Vitals:   02/02/24 1307  TempSrc: Temporal  PainSc: 0-No pain                 Kayse Puccini,W. EDMOND

## 2024-02-02 NOTE — Transfer of Care (Signed)
 Immediate Anesthesia Transfer of Care Note  Patient: Sheri Shelton  Procedure(s) Performed: DISTAL INTERPHALANGEAL JOINT FUSION (Left: Ring Finger)  Patient Location: PACU  Anesthesia Type:General  Level of Consciousness: sedated  Airway & Oxygen Therapy: Patient Spontanous Breathing and Patient connected to face mask oxygen  Post-op Assessment: Report given to RN and Post -op Vital signs reviewed and stable  Post vital signs: Reviewed and stable  Last Vitals:  Vitals Value Taken Time  BP    Temp    Pulse    Resp 16 02/02/24 12:33  SpO2    Vitals shown include unfiled device data.  Last Pain:  Vitals:   02/02/24 0922  TempSrc: Tympanic  PainSc: 4       Patients Stated Pain Goal: 7 (02/02/24 9077)  Complications: No notable events documented.

## 2024-02-02 NOTE — Op Note (Signed)
 I assisted Surgeons and Role:    * Murrell Drivers, MD - Primary    * Murrell Kuba, MD - Assisting on the Procedures: DISTAL INTERPHALANGEAL JOINT FUSION on 02/02/2024.  I provided assistance on this case as follows:Approach, opening of the joint, preparation for fusion., placed of  fixation for fusion, closure of the wound and application of dressing and splint.  Electronically signed by: Kuba Murrell, MD Date: 02/02/2024 Time: 12:20 PM

## 2024-02-02 NOTE — H&P (Signed)
 Sheri Shelton is an 68 y.o. female.   Chief Complaint: dip arthritis HPI: 68 yo female with left ring finger dip joint arthritis.  It is bothersome to her.  She has had fusion of other dip joints and has done well.  She wishes to proceed with left ring finger dip joint fusion.  Allergies: Allergies[1]  Past Medical History:  Diagnosis Date   Angio-edema    Arthritis    Shoulder, Neck   Hereditary angioedema type 3 (HCC)    Hypertension    STD (sexually transmitted disease) 02/23/1987   History of GC/CHL    Past Surgical History:  Procedure Laterality Date   MASS EXCISION Right 07/18/2012   Procedure: MINOR EXCISION MASS DORSUM WITH DIP JOINT ARTHROTOMY RIGHT RING FINGER;  Surgeon: Lamar LULLA Leonor Mickey., MD;  Location: [facility];  Service: Orthopedics;  Laterality: Right;   TUBAL LIGATION  08/1998   WRIST SURGERY  08/22/2015    Family History: Family History  Problem Relation Age of Onset   Heart disease Father    Diabetes Maternal Grandmother    Breast cancer Maternal Grandmother    Diabetes Mother     Social History:   reports that she has never smoked. She has never used smokeless tobacco. She reports current alcohol use of about 3.0 standard drinks of alcohol per week. She reports that she does not use drugs.  Medications: Medications Prior to Admission  Medication Sig Dispense Refill   Berotralstat HCl (ORLADEYO) 110 MG CAPS Take by mouth.     NORVASC  5 MG tablet Take 5 mg by mouth daily.      triamterene-hydrochlorothiazide (MAXZIDE-25) 37.5-25 MG tablet Take 1 tablet by mouth daily.     diphenhydrAMINE  (BENADRYL ) 50 MG capsule Take 50 mg by mouth every 6 (six) hours as needed.      Results for orders placed or performed during the hospital encounter of 02/02/24 (from the past 48 hours)  Basic metabolic panel     Status: Abnormal   Collection Time: 02/01/24 10:02 AM  Result Value Ref Range   Sodium 142 135 - 145 mmol/L   Potassium 3.9 3.5 - 5.1 mmol/L    Chloride 104 98 - 111 mmol/L   CO2 25 22 - 32 mmol/L   Glucose, Bld 149 (H) 70 - 99 mg/dL    Comment: Glucose reference range applies only to samples taken after fasting for at least 8 hours.   BUN 15 8 - 23 mg/dL   Creatinine, Ser 8.90 (H) 0.44 - 1.00 mg/dL   Calcium 9.3 8.9 - 89.6 mg/dL   GFR, Estimated 56 (L) >60 mL/min    Comment: (NOTE) Calculated using the CKD-EPI Creatinine Equation (2021)    Anion gap 13 5 - 15    Comment: Performed at [facility], [address], [place], [place] [address]    DG MINI C-ARM IMAGE ONLY Result Date: 02/02/2024 There is no interpretation for this exam.  This order is for images obtained during a surgical procedure.  Please See Surgeries Tab for more information regarding the procedure.      Blood pressure (!) 147/89, pulse 78, temperature 97.8 F (36.6 C), temperature source Tympanic, resp. rate 18, height 5' 5 (1.651 m), weight 72.9 kg, last menstrual period 09/23/2011, SpO2 100%.  General appearance: alert, cooperative, and appears stated age Head: Normocephalic, without obvious abnormality, atraumatic Neck: supple, symmetrical, trachea midline Extremities: Intact sensation and capillary refill all digits.  +epl/fpl/io.  No wounds.  Skin: Skin color, texture, turgor normal. No rashes or lesions Neurologic: Grossly normal Incision/Wound: none  Assessment/Plan Left ring finger dip joint arthritis.  Non operative and operative treatment options have been discussed with the patient and patient wishes to proceed with operative treatment. Risks, benefits and alternatives of surgery were discussed including risks of blood loss, infection, damage to nerves/vessels/tendons/ligament/bone, failure of surgery, need for additional surgery, complication with wound healing, stiffness, nonunion, malunion.  She voiced understanding of these risks and elected to proceed.    Gavan Nordby 02/02/2024, 10:32 AM      [1]  Allergies Allergen  Reactions   Soy Allergy (Obsolete) Nausea And Vomiting    SOY MILK ONLY

## 2024-02-03 ENCOUNTER — Encounter (HOSPITAL_BASED_OUTPATIENT_CLINIC_OR_DEPARTMENT_OTHER): Payer: Self-pay | Admitting: Orthopedic Surgery

## 2024-02-10 DIAGNOSIS — M19042 Primary osteoarthritis, left hand: Secondary | ICD-10-CM | POA: Diagnosis not present
